# Patient Record
Sex: Female | Born: 1990 | Race: Black or African American | Hispanic: No | Marital: Single | Smoking: Former smoker
Health system: Southern US, Community
[De-identification: ages and names within clinical notes are randomized; demographics above are authoritative.]

## PROBLEM LIST (undated history)

## (undated) DIAGNOSIS — E559 Vitamin D deficiency, unspecified: Secondary | ICD-10-CM

## (undated) DIAGNOSIS — S82142A Displaced bicondylar fracture of left tibia, initial encounter for closed fracture: Secondary | ICD-10-CM

## (undated) DIAGNOSIS — F1290 Cannabis use, unspecified, uncomplicated: Secondary | ICD-10-CM

## (undated) DIAGNOSIS — O24419 Gestational diabetes mellitus in pregnancy, unspecified control: Secondary | ICD-10-CM

## (undated) DIAGNOSIS — F17200 Nicotine dependence, unspecified, uncomplicated: Secondary | ICD-10-CM

## (undated) HISTORY — DX: Gestational diabetes mellitus in pregnancy, unspecified control: O24.419

---

## 2010-01-17 ENCOUNTER — Emergency Department (HOSPITAL_COMMUNITY)
Admission: EM | Admit: 2010-01-17 | Discharge: 2010-01-17 | Payer: Self-pay | Source: Home / Self Care | Admitting: Emergency Medicine

## 2010-01-23 ENCOUNTER — Emergency Department (HOSPITAL_COMMUNITY)
Admission: EM | Admit: 2010-01-23 | Discharge: 2010-01-23 | Payer: Self-pay | Source: Home / Self Care | Admitting: Emergency Medicine

## 2010-09-18 ENCOUNTER — Emergency Department (HOSPITAL_COMMUNITY)
Admission: EM | Admit: 2010-09-18 | Discharge: 2010-09-18 | Disposition: A | Discharge status: Home / Self Care | Payer: No Typology Code available for payment source | Attending: Emergency Medicine | Admitting: Emergency Medicine

## 2010-09-18 ENCOUNTER — Emergency Department (HOSPITAL_COMMUNITY): Payer: Self-pay

## 2010-09-18 DIAGNOSIS — M79609 Pain in unspecified limb: Secondary | ICD-10-CM | POA: Insufficient documentation

## 2011-06-08 ENCOUNTER — Emergency Department (HOSPITAL_COMMUNITY)
Admission: EM | Admit: 2011-06-08 | Discharge: 2011-06-08 | Disposition: A | Discharge status: Home / Self Care | Payer: Medicaid Other | Attending: Emergency Medicine | Admitting: Emergency Medicine

## 2011-06-08 ENCOUNTER — Encounter (HOSPITAL_COMMUNITY): Payer: Self-pay | Admitting: *Deleted

## 2011-06-08 DIAGNOSIS — J029 Acute pharyngitis, unspecified: Secondary | ICD-10-CM | POA: Insufficient documentation

## 2011-06-08 LAB — RAPID STREP SCREEN (MED CTR MEBANE ONLY): Streptococcus, Group A Screen (Direct): NEGATIVE

## 2011-06-08 MED ORDER — AZITHROMYCIN 250 MG PO TABS: ORAL_TABLET | ORAL | Status: AC

## 2011-06-08 MED ORDER — PREDNISONE 20 MG PO TABS
60.0000 mg | ORAL_TABLET | Freq: Once | ORAL | Status: AC
  Administered 2011-06-08: 60 mg via ORAL
  Filled 2011-06-08: qty 3

## 2011-06-08 NOTE — ED Notes (Signed)
The pt has had a sorethroat for one week.  No temp no other symptoms

## 2011-06-08 NOTE — Discharge Instructions (Signed)
Your strep throat test was negative today. Your providers however are concerned for other possible bacterial cause of your throat infection. You were given a prescription for an antibiotic to take for the next 5 days to help treat this infection. Please take this as instructed for the full length of time. Use warm salt rinse to gargle and help with symptoms. Use Tylenol and ibuprofen for pain and fever. Return to the emergency room for any worsening symptoms, difficulty swallowing difficulty breathing or increased swelling in your throat.   Pharyngitis, Viral and Bacterial Pharyngitis is soreness (inflammation) or infection of the pharynx. It is also called a sore throat. CAUSES  Most sore throats are caused by viruses and are part of a cold. However, some sore throats are caused by strep and other bacteria. Sore throats can also be caused by post nasal drip from draining sinuses, allergies and sometimes from sleeping with an open mouth. Infectious sore throats can be spread from person to person by coughing, sneezing and sharing cups or eating utensils. TREATMENT  Sore throats that are viral usually last 3-4 days. Viral illness will get better without medications (antibiotics). Strep throat and other bacterial infections will usually begin to get better about 24-48 hours after you begin to take antibiotics. HOME CARE INSTRUCTIONS   If the caregiver feels there is a bacterial infection or if there is a positive strep test, they will prescribe an antibiotic. The full course of antibiotics must be taken. If the full course of antibiotic is not taken, you or your child may become ill again. If you or your child has strep throat and do not finish all of the medication, serious heart or kidney diseases may develop.   Drink enough water and fluids to keep your urine clear or pale yellow.   Only take over-the-counter or prescription medicines for pain, discomfort or fever as directed by your caregiver.    Get lots of rest.   Gargle with salt water ( tsp. of salt in a glass of water) as often as every 1-2 hours as you need for comfort.   Hard candies may soothe the throat if individual is not at risk for choking. Throat sprays or lozenges may also be used.  SEEK MEDICAL CARE IF:   Large, tender lumps in the neck develop.   A rash develops.   Green, yellow-brown or bloody sputum is coughed up.   Your baby is older than 3 months with a rectal temperature of 100.5 F (38.1 C) or higher for more than 1 day.  SEEK IMMEDIATE MEDICAL CARE IF:   A stiff neck develops.   You or your child are drooling or unable to swallow liquids.   You or your child are vomiting, unable to keep medications or liquids down.   You or your child has severe pain, unrelieved with recommended medications.   You or your child are having difficulty breathing (not due to stuffy nose).   You or your child are unable to fully open your mouth.   You or your child develop redness, swelling, or severe pain anywhere on the neck.   You have a fever.   Your baby is older than 3 months with a rectal temperature of 102 F (38.9 C) or higher.   Your baby is 55 months old or younger with a rectal temperature of 100.4 F (38 C) or higher.  MAKE SURE YOU:   Understand these instructions.   Will watch your condition.   Will get  help right away if you are not doing well or get worse.  Document Released: 02/01/2005 Document Revised: 01/21/2011 Document Reviewed: 05/01/2007 [facility] Patient Information 2012 [place], [place].   RESOURCE GUIDE  Dental Problems  Patients with Medicaid: [facility] [phone] [address]                                           [phone] [address] Phone:  [phone]                                                  Phone:  [phone]  If unable to pay or uninsured, contact:  [facility] or [facility]. to become  qualified for the adult dental clinic.  Chronic Pain Problems Contact [facility]  [phone] Patients need to be referred by their primary care doctor.  Insufficient Money for Medicine Contact [employer]:  call "211" or [facility] (224)221-4042.  No Primary Care Doctor Call [facility]  2135036857 Other agencies that provide inexpensive medical care    [facility]  610-118-3096    [facility]  [phone]    [facility]  [phone]    [facility]  [phone]    [facility]  (807) [phone]    [facility]  [phone]  Psychological Services [facility]  [phone] [facility]  [phone] [facility]   [phone] (emergency services 6417002648)  Substance Abuse Resources [facility]  (512)105-7720 [facility] 984-329-4675 [facility] ([phone] Floydene Flock (254)715-1673 Residential & Outpatient Substance Abuse Program  (567) 841-3464  Abuse/Neglect [facility] Child Abuse Hotline 631-802-9211 [facility] Child Abuse Hotline (302) 122-6894 (After Hours)  Emergency Shelter [facility] 787-102-3471  M[facility] [place] [facility] [phone] [facility] [phone]  MRSA Hotline #:   646-145-2099    [facility] Resources  [facility] [place]     [employer]                          [facility] Dept. [address] [place]                       [address]      [address]  [place]                                                Cristobal Goldmann Phone:  [phone]  Phone:  [phone]                 Phone:  [phone]  [facility] Phone:  [phone]  [place] Child Abuse Hotline (336) 342-1394 (336) 342-3537 (After Hours)   

## 2011-06-08 NOTE — ED Notes (Signed)
PT reports a sore throat started on Monday.

## 2011-06-08 NOTE — ED Provider Notes (Signed)
History     CSN: [id number]  Arrival date & time 06/08/11  2059   First MD Initiated Contact with Patient 06/08/11 2322      Chief Complaint  Patient presents with  . Sore Throat    HPI  History provided by the patient. Patient is a 21 year old female with no significant past medical history who presents with complaints of acute onset sore throat yesterday morning after waking up. Patient reports pain has been persistent and is worse with swallowing. Pain is severe. Patient has tried some Tylenol at home without significant improvement. She denies any other aggravating or alleviating factors. Patient denies any associated fever, chills, sweats, rhinorrhea, nasal congestion, cough, nausea or vomiting. Patient denies any known sick contacts.    History reviewed. No pertinent past medical history.  History reviewed. No pertinent past surgical history.  No family history on file.  History  Substance Use Topics  . Smoking status: Never Smoker   . Smokeless tobacco: Not on file  . Alcohol Use: No    OB History    Grav Para Term Preterm Abortions TAB SAB Ect Mult Living                  Review of Systems  Constitutional: Positive for appetite change. Negative for fever, chills and fatigue.  HENT: Positive for sore throat. Negative for ear pain, congestion, rhinorrhea, neck pain and neck stiffness.   Respiratory: Negative for cough and shortness of breath.   Gastrointestinal: Negative for nausea and vomiting.  Musculoskeletal: Negative for myalgias.  Skin: Negative for rash.  Neurological: Negative for dizziness, light-headedness and headaches.    Allergies  Review of patient's allergies indicates no known allergies.  Home Medications  No current outpatient prescriptions on file.  BP 107/57  Pulse 108  Temp(Src) 99.8 F (37.7 C) (Oral)  Resp 18  SpO2 99%  LMP 06/04/2011  Physical Exam  Nursing note and vitals reviewed. Constitutional: She is oriented to person,  place, and time. She appears well-developed and well-nourished. No distress.  HENT:  Head: Normocephalic and atraumatic.       Pharynx erythematous with slight exudate. Tonsils normal size. Uvula midline. No signs for PTA.  Neck: Normal range of motion. Neck supple.       No meningeal signs  Cardiovascular: Normal rate and regular rhythm.   Pulmonary/Chest: Effort normal and breath sounds normal. No respiratory distress. She has no wheezes. She has no rales.  Abdominal: Soft. There is no tenderness.  Lymphadenopathy:    She has cervical adenopathy.  Neurological: She is alert and oriented to person, place, and time.  Skin: Skin is warm and dry. No rash noted.  Psychiatric: She has a normal mood and affect. Her behavior is normal.    ED Course  Procedures   Results for orders placed during the hospital encounter of 06/08/11  RAPID STREP SCREEN      Component Value Range   Streptococcus, Group A Screen (Direct) NEGATIVE  NEGATIVE        1. Pharyngitis       MDM  11:20 PM patient seen and evaluated. Patient in no acute distress.        Angus Seller, [place] 06/09/11 [phone]

## 2011-06-09 NOTE — ED Provider Notes (Signed)
Medical screening examination/treatment/procedure(s) were performed by non-physician practitioner and as supervising physician I was immediately available for consultation/collaboration.   Vida Roller, MD 06/09/11 [phone]

## 2013-08-01 ENCOUNTER — Emergency Department (HOSPITAL_COMMUNITY): Payer: Self-pay

## 2013-08-01 ENCOUNTER — Encounter (HOSPITAL_COMMUNITY): Payer: Self-pay | Admitting: Emergency Medicine

## 2013-08-01 ENCOUNTER — Inpatient Hospital Stay (HOSPITAL_COMMUNITY)
Admission: EM | Admit: 2013-08-01 | Discharge: 2013-08-06 | DRG: 488 | Disposition: A | Discharge status: Home Health | Payer: Self-pay | Attending: Orthopedic Surgery | Admitting: Orthopedic Surgery

## 2013-08-01 DIAGNOSIS — R443 Hallucinations, unspecified: Secondary | ICD-10-CM | POA: Diagnosis not present

## 2013-08-01 DIAGNOSIS — F1290 Cannabis use, unspecified, uncomplicated: Secondary | ICD-10-CM | POA: Diagnosis present

## 2013-08-01 DIAGNOSIS — S82142A Displaced bicondylar fracture of left tibia, initial encounter for closed fracture: Secondary | ICD-10-CM

## 2013-08-01 DIAGNOSIS — S82109A Unspecified fracture of upper end of unspecified tibia, initial encounter for closed fracture: Secondary | ICD-10-CM | POA: Diagnosis present

## 2013-08-01 DIAGNOSIS — F1210 Cannabis abuse, uncomplicated: Secondary | ICD-10-CM | POA: Diagnosis present

## 2013-08-01 DIAGNOSIS — M949 Disorder of cartilage, unspecified: Secondary | ICD-10-CM

## 2013-08-01 DIAGNOSIS — E559 Vitamin D deficiency, unspecified: Secondary | ICD-10-CM | POA: Diagnosis present

## 2013-08-01 DIAGNOSIS — S82209A Unspecified fracture of shaft of unspecified tibia, initial encounter for closed fracture: Principal | ICD-10-CM | POA: Diagnosis present

## 2013-08-01 DIAGNOSIS — W108XXA Fall (on) (from) other stairs and steps, initial encounter: Secondary | ICD-10-CM | POA: Diagnosis present

## 2013-08-01 DIAGNOSIS — Y9210 Unspecified residential institution as the place of occurrence of the external cause: Secondary | ICD-10-CM | POA: Diagnosis present

## 2013-08-01 DIAGNOSIS — E876 Hypokalemia: Secondary | ICD-10-CM | POA: Diagnosis present

## 2013-08-01 DIAGNOSIS — D62 Acute posthemorrhagic anemia: Secondary | ICD-10-CM | POA: Diagnosis not present

## 2013-08-01 DIAGNOSIS — F17200 Nicotine dependence, unspecified, uncomplicated: Secondary | ICD-10-CM | POA: Diagnosis present

## 2013-08-01 DIAGNOSIS — M899 Disorder of bone, unspecified: Secondary | ICD-10-CM | POA: Diagnosis present

## 2013-08-01 DIAGNOSIS — R7989 Other specified abnormal findings of blood chemistry: Secondary | ICD-10-CM | POA: Diagnosis present

## 2013-08-01 HISTORY — DX: Cannabis use, unspecified, uncomplicated: F12.90

## 2013-08-01 HISTORY — DX: Nicotine dependence, unspecified, uncomplicated: F17.200

## 2013-08-01 HISTORY — DX: Vitamin D deficiency, unspecified: E55.9

## 2013-08-01 HISTORY — DX: Displaced bicondylar fracture of left tibia, initial encounter for closed fracture: S82.142A

## 2013-08-01 MED ORDER — HYDROMORPHONE HCL PF 1 MG/ML IJ SOLN
0.5000 mg | INTRAMUSCULAR | Status: AC | PRN
  Administered 2013-08-01 – 2013-08-02 (×3): 0.5 mg via INTRAVENOUS
  Filled 2013-08-01 (×3): qty 1

## 2013-08-01 NOTE — ED Notes (Signed)
Per PTAR: Pt fell from balcony onto gravel area, fall was from about 5 feet. No neck or back pain. Pt c/o pain to L knee, swelling noted to site, no deformity noted.

## 2013-08-01 NOTE — ED Notes (Signed)
Bed: WA07 Expected date: 08/01/13 Expected time:  Means of arrival:  Comments: EMS

## 2013-08-02 ENCOUNTER — Inpatient Hospital Stay (HOSPITAL_COMMUNITY): Payer: Self-pay

## 2013-08-02 ENCOUNTER — Emergency Department (HOSPITAL_COMMUNITY): Payer: Self-pay

## 2013-08-02 ENCOUNTER — Encounter (HOSPITAL_COMMUNITY): Payer: Self-pay | Admitting: Orthopedic Surgery

## 2013-08-02 DIAGNOSIS — S82142A Displaced bicondylar fracture of left tibia, initial encounter for closed fracture: Secondary | ICD-10-CM

## 2013-08-02 DIAGNOSIS — R7989 Other specified abnormal findings of blood chemistry: Secondary | ICD-10-CM | POA: Diagnosis present

## 2013-08-02 DIAGNOSIS — E559 Vitamin D deficiency, unspecified: Secondary | ICD-10-CM

## 2013-08-02 DIAGNOSIS — F17200 Nicotine dependence, unspecified, uncomplicated: Secondary | ICD-10-CM | POA: Diagnosis present

## 2013-08-02 HISTORY — DX: Vitamin D deficiency, unspecified: E55.9

## 2013-08-02 HISTORY — DX: Displaced bicondylar fracture of left tibia, initial encounter for closed fracture: S82.142A

## 2013-08-02 HISTORY — DX: Nicotine dependence, unspecified, uncomplicated: F17.200

## 2013-08-02 LAB — URINALYSIS, ROUTINE W REFLEX MICROSCOPIC
Bilirubin Urine: NEGATIVE
Glucose, UA: NEGATIVE mg/dL
Ketones, ur: 15 mg/dL — AB
Leukocytes, UA: NEGATIVE
Nitrite: NEGATIVE
Protein, ur: NEGATIVE mg/dL
Specific Gravity, Urine: 1.017 (ref 1.005–1.030)
UROBILINOGEN UA: 1 mg/dL (ref 0.0–1.0)
pH: 7 (ref 5.0–8.0)

## 2013-08-02 LAB — TYPE AND SCREEN
ABO/RH(D): O POS
ANTIBODY SCREEN: NEGATIVE

## 2013-08-02 LAB — APTT: APTT: 28 s (ref 24–37)

## 2013-08-02 LAB — ALBUMIN: Albumin: 4 g/dL (ref 3.5–5.2)

## 2013-08-02 LAB — RAPID URINE DRUG SCREEN, HOSP PERFORMED
Amphetamines: NOT DETECTED
BARBITURATES: NOT DETECTED
BENZODIAZEPINES: NOT DETECTED
Cocaine: NOT DETECTED
Opiates: POSITIVE — AB
TETRAHYDROCANNABINOL: POSITIVE — AB

## 2013-08-02 LAB — URINE MICROSCOPIC-ADD ON

## 2013-08-02 LAB — BASIC METABOLIC PANEL
BUN: 9 mg/dL (ref 6–23)
CALCIUM: 9.4 mg/dL (ref 8.4–10.5)
CHLORIDE: 102 meq/L (ref 96–112)
CO2: 21 meq/L (ref 19–32)
CREATININE: 0.75 mg/dL (ref 0.50–1.10)
GFR calc non Af Amer: >90 mL/min (ref >90)
Glucose, Bld: 122 mg/dL — ABNORMAL HIGH (ref 70–99)
POTASSIUM: 3.2 meq/L — AB (ref 3.7–5.3)
SODIUM: 139 meq/L (ref 137–147)

## 2013-08-02 LAB — CBC
HCT: 39.5 % (ref 36.0–46.0)
Hemoglobin: 13.7 g/dL (ref 12.0–15.0)
MCH: 32 pg (ref 26.0–34.0)
MCHC: 34.7 g/dL (ref 30.0–36.0)
MCV: 92.3 fL (ref 78.0–100.0)
PLATELETS: 295 10*3/uL (ref 150–400)
RBC: 4.28 MIL/uL (ref 3.87–5.11)
RDW: 12 % (ref 11.5–15.5)
WBC: 9.2 10*3/uL (ref 4.0–10.5)

## 2013-08-02 LAB — VITAMIN D 25 HYDROXY (VIT D DEFICIENCY, FRACTURES): Vit D, 25-Hydroxy: 22 ng/mL — ABNORMAL LOW (ref 30–89)

## 2013-08-02 LAB — HCG, SERUM, QUALITATIVE: Preg, Serum: NEGATIVE

## 2013-08-02 LAB — SURGICAL PCR SCREEN
MRSA, PCR: NEGATIVE
Staphylococcus aureus: NEGATIVE

## 2013-08-02 LAB — PROTIME-INR
INR: 1.13 (ref 0.00–1.49)
Prothrombin Time: 14.3 seconds (ref 11.6–15.2)

## 2013-08-02 LAB — ABO/RH: ABO/RH(D): O POS

## 2013-08-02 MED ORDER — MAGNESIUM CITRATE PO SOLN: 1.0000 | Freq: Once | ORAL | Status: AC | PRN

## 2013-08-02 MED ORDER — SODIUM CHLORIDE 0.9 % IV SOLN
1000.0000 mL | Freq: Once | INTRAVENOUS | Status: AC
  Administered 2013-08-02: 1000 mL via INTRAVENOUS

## 2013-08-02 MED ORDER — ACETAMINOPHEN 500 MG PO TABS
1000.0000 mg | ORAL_TABLET | Freq: Three times a day (TID) | ORAL | Status: DC
  Administered 2013-08-02 – 2013-08-03 (×3): 1000 mg via ORAL
  Filled 2013-08-02 (×6): qty 2

## 2013-08-02 MED ORDER — DEXTROSE 5 % IV SOLN
500.0000 mg | Freq: Four times a day (QID) | INTRAVENOUS | Status: DC | PRN
  Filled 2013-08-02: qty 5

## 2013-08-02 MED ORDER — VITAMIN D (ERGOCALCIFEROL) 1.25 MG (50000 UNIT) PO CAPS
50000.0000 [IU] | ORAL_CAPSULE | ORAL | Status: DC
  Administered 2013-08-04: 50000 [IU] via ORAL
  Filled 2013-08-02: qty 1

## 2013-08-02 MED ORDER — OXYCODONE HCL 5 MG PO TABS
5.0000 mg | ORAL_TABLET | ORAL | Status: DC | PRN
  Administered 2013-08-02 (×2): 15 mg via ORAL
  Filled 2013-08-02 (×2): qty 3

## 2013-08-02 MED ORDER — METHOCARBAMOL 500 MG PO TABS
500.0000 mg | ORAL_TABLET | Freq: Four times a day (QID) | ORAL | Status: DC | PRN
  Administered 2013-08-02 (×2): 1000 mg via ORAL
  Administered 2013-08-03: 500 mg via ORAL
  Filled 2013-08-02 (×2): qty 2

## 2013-08-02 MED ORDER — METHOCARBAMOL 500 MG PO TABS: 500.0000 mg | ORAL_TABLET | Freq: Four times a day (QID) | ORAL | Status: DC | PRN

## 2013-08-02 MED ORDER — SENNA 8.6 MG PO TABS
1.0000 | ORAL_TABLET | Freq: Two times a day (BID) | ORAL | Status: DC
  Administered 2013-08-02 – 2013-08-06 (×9): 8.6 mg via ORAL
  Filled 2013-08-02 (×15): qty 1

## 2013-08-02 MED ORDER — HYDROMORPHONE HCL PF 1 MG/ML IJ SOLN
0.5000 mg | INTRAMUSCULAR | Status: DC | PRN
  Administered 2013-08-02 (×3): 1 mg via INTRAVENOUS
  Administered 2013-08-03: 0.5 mg via INTRAVENOUS
  Administered 2013-08-03 (×2): 1 mg via INTRAVENOUS
  Administered 2013-08-03: 0.5 mg via INTRAVENOUS
  Filled 2013-08-02 (×5): qty 1

## 2013-08-02 MED ORDER — SORBITOL 70 % SOLN
30.0000 mL | Freq: Every day | Status: DC | PRN
  Filled 2013-08-02: qty 30

## 2013-08-02 MED ORDER — ONDANSETRON HCL 4 MG/2ML IJ SOLN
4.0000 mg | Freq: Four times a day (QID) | INTRAMUSCULAR | Status: DC | PRN
  Administered 2013-08-02 (×2): 4 mg via INTRAVENOUS
  Filled 2013-08-02 (×3): qty 2

## 2013-08-02 MED ORDER — SODIUM CHLORIDE 0.9 % IV SOLN
INTRAVENOUS | Status: DC
  Administered 2013-08-02 – 2013-08-05 (×5): via INTRAVENOUS

## 2013-08-02 MED ORDER — POLYETHYLENE GLYCOL 3350 17 G PO PACK
17.0000 g | PACK | Freq: Every day | ORAL | Status: DC | PRN
  Filled 2013-08-02: qty 1

## 2013-08-02 MED ORDER — MORPHINE SULFATE 2 MG/ML IJ SOLN: 0.5000 mg | INTRAMUSCULAR | Status: DC | PRN

## 2013-08-02 MED ORDER — SODIUM CHLORIDE 0.9 % IV SOLN
1000.0000 mL | INTRAVENOUS | Status: DC
Start: 2013-08-02 — End: 2013-08-02

## 2013-08-02 MED ORDER — METHOCARBAMOL 1000 MG/10ML IJ SOLN
500.0000 mg | Freq: Four times a day (QID) | INTRAVENOUS | Status: DC | PRN
  Filled 2013-08-02 (×2): qty 10

## 2013-08-02 MED ORDER — HYDROMORPHONE HCL PF 1 MG/ML IJ SOLN
0.5000 mg | INTRAMUSCULAR | Status: DC | PRN
  Administered 2013-08-02 (×4): 0.5 mg via INTRAVENOUS
  Filled 2013-08-02 (×4): qty 1

## 2013-08-02 MED ORDER — OXYCODONE HCL 5 MG PO TABS: 5.0000 mg | ORAL_TABLET | ORAL | Status: DC | PRN

## 2013-08-02 MED ORDER — CEFAZOLIN SODIUM-DEXTROSE 2-3 GM-% IV SOLR
2.0000 g | Freq: Once | INTRAVENOUS | Status: AC
  Administered 2013-08-03 (×2): 2 g via INTRAVENOUS
  Filled 2013-08-02 (×2): qty 50

## 2013-08-02 MED ORDER — HYDROCODONE-ACETAMINOPHEN 5-325 MG PO TABS: 1.0000 | ORAL_TABLET | Freq: Four times a day (QID) | ORAL | Status: DC | PRN

## 2013-08-02 NOTE — Progress Notes (Signed)
Orthopedic Tech Progress Note Patient Details:  Moshe SalisburyDesirae N Galicia 09/29/1990 [id number]  Ortho Devices Type of Ortho Device: Lenora BoysWatson Jones splint Ortho Device/Splint Location: long leg watson jones Ortho Device/Splint Interventions: Application   Cammer, Mickie BailJennifer Carol 08/02/2013, 9:01 AM

## 2013-08-02 NOTE — ED Provider Notes (Signed)
CSN: [id number]     Arrival date & time 08/01/13  2258 History   First MD Initiated Contact with Patient 08/01/13 2306     Chief Complaint  Patient presents with  . Fall  . Knee Pain      HPI The majority of the information was obtained from the patient's friend secondary to severe pain in the left knee.  As reported the patient was playing with friends and was going down a set of stairs when she fell and twisted her left knee and also fell onto her left knee.  It's unclear as to whether or not she tried to jump down the steps.  She reports severe pain in her left knee.  She denies numbness or weakness in her left foot.  Her pain is worsened by palpation and movement of her left knee.  Denies neck injury.  No head injury.  No neck pain.  No weakness of arms.  No chest pain or shortness of breath.  No abdominal pain or back pain.   History reviewed. No pertinent past medical history. History reviewed. No pertinent past surgical history. No family history on file. History  Substance Use Topics  . Smoking status: Never Smoker   . Smokeless tobacco: Not on file  . Alcohol Use: No   OB History   Grav Para Term Preterm Abortions TAB SAB Ect Mult Living                 Review of Systems  All other systems reviewed and are negative.     Allergies  Review of patient's allergies indicates no known allergies.  Home Medications   Prior to Admission medications   Not on File   BP 124/62  Pulse 98  Temp(Src) 98.5 F (36.9 C) (Oral)  Resp 24  Ht 5' (1.524 m)  Wt 110 lb (49.896 kg)  BMI 21.48 kg/m2  SpO2 100% Physical Exam  Nursing note and vitals reviewed. Constitutional: She is oriented to person, place, and time. She appears well-developed and well-nourished. No distress.  HENT:  Head: Normocephalic and atraumatic.  Eyes: EOM are normal.  Neck: Normal range of motion.  Cardiovascular: Normal rate, regular rhythm and normal heart sounds.   Pulmonary/Chest: Effort normal  and breath sounds normal.  Abdominal: Soft. She exhibits no distension. There is no tenderness.  Musculoskeletal: Normal range of motion.  Tenderness and swelling of left knee.  Compartments of left lower extremity are soft.  Normal PT and DP pulse palpable left foot.  Pain with palpation and range of motion of left knee.  Neurological: She is alert and oriented to person, place, and time.  Skin: Skin is warm and dry.  Psychiatric: She has a normal mood and affect. Judgment normal.    ED Course  Procedures (including critical care time) Labs Review Labs Reviewed  BASIC METABOLIC PANEL - Abnormal; Notable for the following:    Potassium 3.2 (*)    Glucose, Bld 122 (*)    All other components within normal limits  CBC  HCG, SERUM, QUALITATIVE  PROTIME-INR  URINALYSIS, ROUTINE W REFLEX MICROSCOPIC  APTT  ALBUMIN  VITAMIN D 25 HYDROXY  TYPE AND SCREEN   SPLINT APPLICATION Date/Time: 06/23/2012 3:38 PM Authorized by: Lyanne CoAMPOS,KEVIN M Consent: Verbal consent obtained. Risks and benefits: risks, benefits and alternatives were discussed Consent given by: patient Splint applied by: orthopedic technician Location details: left knee immobolizer Splint type:  knee immobolizer Supplies used:  knee immobolizer Post-procedure: The splinted body part  was neurovascularly unchanged following the procedure. Patient tolerance: Patient tolerated the procedure well with no immediate complications.     Imaging Review Dg Knee Complete 4 Views Left  08/01/2013   CLINICAL DATA:  Fall.  Left knee pain.  EXAM: LEFT KNEE - COMPLETE 4+ VIEW  COMPARISON:  None.  FINDINGS: Bicondylar tibial plateau fracture is present extending into the metaphysis. Depression of the medial plateau. Lipohemarthrosis evident on the lateral view. Based on metaphyseal extension, this is most compatible with Schatzker 6 fracture. Distal femur appears intact. Patella appears intact.  IMPRESSION: Bicondylar tibial plateau fracture  with meta diaphyseal extension most compatible with Schatzker 6.   Electronically Signed   By: Andreas NewportGeoffrey  Lamke M.D.   On: 08/01/2013 23:57  I personally reviewed the imaging tests through PACS system I reviewed available ER/hospitalization records through the EMR    EKG Interpretation None      MDM   Final diagnoses:  Fracture of left tibial plateau    I spoke with Dr. Roda ShuttersXu was personally reviewed the patient's images and agree use to admission to the hospital.  Compartment soft at this time.  Normal pulses in left foot.  Doubt vascular injury.  Patient and family updated    Lyanne CoKevin M Campos, MD 08/02/13 [phone]

## 2013-08-02 NOTE — ED Notes (Signed)
[facility] at bedside 

## 2013-08-02 NOTE — Progress Notes (Signed)
UR COMPLETED  

## 2013-08-02 NOTE — Treatment Plan (Signed)
Dr. Carola FrostHandy has agreed to take over care of patient.  Erin Matthews. Michael Xu, MD [facility] [phone] 7:56 AM

## 2013-08-02 NOTE — ED Notes (Signed)
[facility], secretary stated nurse unavailable but will call me when she's ready

## 2013-08-02 NOTE — H&P (Signed)
Orthopaedic Trauma Service H&P   Chief Complaint:  Left tibial plateau fracture  HPI:   Patient is a 23 year old black female who fell down some stairs yesterday approximately 5 feet she was playing with friends. Patient uncertain as to how her leg bent under her but she had immediate onset of pain and inability to bear weight on her left leg. Patient was brought to [place] for evaluation where she was found to have a complex left tibial plateau fracture. Patient was seen and evaluated by the emergency room physician and was admitted to the medical surgical floor. Patient seen today not orthopedic trauma service for evaluation. She complains of left knee pain only. She denies any numbness or tingling in lower extremity. She denies any loss of consciousness no head or neck pain. No shortness of breath, no chest pain no abdominal pain. No nausea or vomiting. No other issues are noted. Pain is relieved with rest and pain medication. It is exacerbated with motion. It is primarily located about her left knee. No radiation down to her ankle or up to her hip.  Patient reports that her dietary intake is at in terms of calcium and vitamin D. She does smoke regularly about a half to three quarters a pack a day. No other drug use or alcohol use is noted other than social alcohol. Patient is employed at [employer].  Past Medical History  History reviewed. No pertinent past medical history.  Surgical history  L ankle surgery as infant   Family History  No family history on file.  Social History:  reports that she has never smoked. She does not have any smokeless tobacco history on file. She reports that she does not drink alcohol or use illicit drugs.  Allergies: No Known Allergies  No prescriptions prior to admission    Results for orders placed during the hospital encounter of 08/01/13 (from the past 48 hour(s))  CBC     Status: None   Collection Time    08/01/13 11:48 PM      Result Value Ref  Range   WBC 9.2  4.0 - 10.5 K/uL   RBC 4.28  3.87 - 5.11 MIL/uL   Hemoglobin 13.7  12.0 - 15.0 g/dL   HCT 39.5  36.0 - 46.0 %   MCV 92.3  78.0 - 100.0 fL   MCH 32.0  26.0 - 34.0 pg   MCHC 34.7  30.0 - 36.0 g/dL   RDW 12.0  11.5 - 15.5 %   Platelets 295  150 - 400 K/uL  BASIC METABOLIC PANEL     Status: Abnormal   Collection Time    08/01/13 11:48 PM      Result Value Ref Range   Sodium 139  137 - 147 mEq/L   Potassium 3.2 (*) 3.7 - 5.3 mEq/L   Chloride 102  96 - 112 mEq/L   CO2 21  19 - 32 mEq/L   Glucose, Bld 122 (*) 70 - 99 mg/dL   BUN 9  6 - 23 mg/dL   Creatinine, Ser 0.75  0.50 - 1.10 mg/dL   Calcium 9.4  8.4 - 10.5 mg/dL   GFR calc non Af Amer >90  >90 mL/min   GFR calc Af Amer >90  >90 mL/min   Comment: (NOTE)     The eGFR has been calculated using the CKD EPI equation.     This calculation has not been validated in all clinical situations.     eGFR's persistently <  90 mL/min signify possible Chronic Kidney     Disease.  HCG, SERUM, QUALITATIVE     Status: None   Collection Time    08/01/13 11:48 PM      Result Value Ref Range   Preg, Serum NEGATIVE  NEGATIVE   Comment:            THE SENSITIVITY OF THIS     METHODOLOGY IS >10 mIU/mL.  PROTIME-INR     Status: None   Collection Time    08/02/13  1:09 AM      Result Value Ref Range   Prothrombin Time 14.3  11.6 - 15.2 seconds   INR 1.13  0.00 - 1.49  APTT     Status: None   Collection Time    08/02/13  1:09 AM      Result Value Ref Range   aPTT 28  24 - 37 seconds  ALBUMIN     Status: None   Collection Time    08/02/13  1:09 AM      Result Value Ref Range   Albumin 4.0  3.5 - 5.2 g/dL  VITAMIN D 25 HYDROXY     Status: Abnormal   Collection Time    08/02/13  1:09 AM      Result Value Ref Range   Vit D, 25-Hydroxy 22 (*) 30 - 89 ng/mL   Comment: (NOTE)     This assay accurately quantifies Vitamin D, which is the sum of the     25-Hydroxy forms of Vitamin D2 and D3.  Studies have shown that the     optimum  concentration of 25-Hydroxy Vitamin D is 30 ng/mL or higher.      Concentrations of Vitamin D between 20 and 29 ng/mL are considered to     be insufficient and concentrations less than 20 ng/mL are considered     to be deficient for Vitamin D.     Performed at [facility]     Status: None   Collection Time    08/02/13  1:09 AM      Result Value Ref Range   ABO/RH(D) O POS     Antibody Screen NEG     Sample Expiration 08/05/2013    ABO/RH     Status: None   Collection Time    08/02/13  1:09 AM      Result Value Ref Range   ABO/RH(D) O POS    SURGICAL PCR SCREEN     Status: None   Collection Time    08/02/13  3:41 AM      Result Value Ref Range   MRSA, PCR NEGATIVE  NEGATIVE   Staphylococcus aureus NEGATIVE  NEGATIVE   Comment:            The Xpert SA Assay (FDA     approved for NASAL specimens     in patients over 10 years of age),     is one component of     a comprehensive surveillance     program.  Test performance has     been validated by [employer] for patients greater     than or equal to 23 year old.     It is not intended     to diagnose infection nor to     guide or monitor treatment.  URINALYSIS, ROUTINE W REFLEX MICROSCOPIC     Status: Abnormal   Collection Time  08/02/13  5:12 AM      Result Value Ref Range   Color, Urine YELLOW  YELLOW   APPearance CLEAR  CLEAR   Specific Gravity, Urine 1.017  1.005 - 1.030   pH 7.0  5.0 - 8.0   Glucose, UA NEGATIVE  NEGATIVE mg/dL   Hgb urine dipstick TRACE (*) NEGATIVE   Bilirubin Urine NEGATIVE  NEGATIVE   Ketones, ur 15 (*) NEGATIVE mg/dL   Protein, ur NEGATIVE  NEGATIVE mg/dL   Urobilinogen, UA 1.0  0.0 - 1.0 mg/dL   Nitrite NEGATIVE  NEGATIVE   Leukocytes, UA NEGATIVE  NEGATIVE  URINE MICROSCOPIC-ADD ON     Status: None   Collection Time    08/02/13  5:12 AM      Result Value Ref Range   Squamous Epithelial / LPF RARE  RARE   WBC, UA 0-2  <3 WBC/hpf   RBC / HPF 3-6  <3  RBC/hpf   Bacteria, UA RARE  RARE   Ct Knee Left Wo Contrast  08/02/2013   CLINICAL DATA:  Fall.  Knee pain.  Tibial plateau fracture.  EXAM: CT OF THE Left KNEE WITHOUT CONTRAST  TECHNIQUE: Multidetector CT imaging of the left knee was performed according to the standard protocol. Multiplanar CT image reconstructions were also generated.  COMPARISON:  Radiographs yesterday.  FINDINGS: Comminuted depressed tibial plateau fracture is present involving both tibial condyles. Additionally, there is a impaction fracture of the proximal fibular head adjacent to the proximal tibia-fibular joint. There is a large bone fragment containing the tibial eminence. Marked depression and impaction of the lateral tibial condyle weight-bearing surface, nearly to the level of the lateral tibial metaphyseal curve on coronal reconstructed images. Maximal depression is 25 mm in the lateral plateau. The medial plateau is comminuted but mildly displaced with mild depression estimated at 5 mm. Fracture planes extend into the metaphysis.  Fibular shaft appears intact. Longitudinal fracture plane through the tibial diaphysis extends about 9 cm distally the to the level of the prominent vascular channel. The patella and distal femur appear intact.  IMPRESSION: Schatzker 6 bicondylar tibial plateau fracture. Marked depression and rotation of lateral tibial condyle articular surface fragment. Impaction fracture of the fibular head adjacent to the proximal tibia-fibular joint   Electronically Signed   By: Dereck Ligas M.D.   On: 08/02/2013 01:36   Dg Knee Complete 4 Views Left  08/01/2013   CLINICAL DATA:  Fall.  Left knee pain.  EXAM: LEFT KNEE - COMPLETE 4+ VIEW  COMPARISON:  None.  FINDINGS: Bicondylar tibial plateau fracture is present extending into the metaphysis. Depression of the medial plateau. Lipohemarthrosis evident on the lateral view. Based on metaphyseal extension, this is most compatible with Schatzker 6 fracture. Distal  femur appears intact. Patella appears intact.  IMPRESSION: Bicondylar tibial plateau fracture with meta diaphyseal extension most compatible with Schatzker 6.   Electronically Signed   By: Dereck Ligas M.D.   On: 08/01/2013 23:57    Review of Systems  Constitutional: Negative for fever and chills.  Eyes: Negative for blurred vision and double vision.       Wears glasses  Respiratory: Negative for shortness of breath and wheezing.   Cardiovascular: Negative for chest pain and palpitations.  Gastrointestinal: Negative for nausea, vomiting and abdominal pain.  Genitourinary: Negative for dysuria, frequency and flank pain.  Musculoskeletal:       Left knee pain  Neurological: Negative for tingling, sensory change and headaches.  Endo/Heme/Allergies: Does not  bruise/bleed easily.    Blood pressure 150/82, pulse 92, temperature 98.4 F (36.9 C), temperature source Oral, resp. rate 16, height 5' (1.524 m), weight 52.028 kg (114 lb 11.2 oz), SpO2 100.00%. Physical Exam  Constitutional: She is oriented to person, place, and time. Vital signs are normal. She appears well-developed and well-nourished. She is cooperative. No distress.  HENT:  Head: Normocephalic and atraumatic.  Nose: Nose normal.  Mouth/Throat: Oropharynx is clear and moist and mucous membranes are normal.  Eyes: EOM are normal. Pupils are equal, round, and reactive to light.  glasses  Neck: Normal range of motion and full passive range of motion without pain. No spinous process tenderness and no muscular tenderness present.  Cardiovascular: Normal rate, regular rhythm, S1 normal and S2 normal.   Respiratory: Effort normal. No respiratory distress. She has no wheezes. She has no rhonchi. She has no rales.  Clear to auscultation bilaterally  GI:  Soft, nontender, nondistended,+ bowel sounds  Musculoskeletal:  Bilateral upper extremities are without acute findings. Motor and sensory functions are grossly intact. Extremities  are warm with palpable peripheral pulses.  Pelvis   No instability with bony evaluation. No pain with lateral compression or AP compression  Right lower extremity   No acute findings.   Full active range of motion   No blocks to motion noted at the ankle knee or hip.   No Pain with manipulation of the right leg   Motor and sensory functions grossly intact   Palpable peripheral pulses are noted  Left lower extremity Inspection:   Patient is in a knee immobilizer. A moderate left knee effusion is appreciated   No open wounds noted to the knee   Old surgical scar noted to the inferior left ankle  Bony eval:    Tender to palpation proximal tibia     Hip, femur, patella, ankle and foot are without tenderness  Soft tissue:    Unable to evaluate ligamentous structures of the left knee the to acute fracture    Soft tissue structures of the thigh, ankle and foot are unremarkable. Ankle is stable evaluation     Skin over the proximal tibia is soft and wrinkled with compression. There is swelling present when compared to the contralateral side but soft tissue is still supple  ROM:    Good range of motion of the ankle is noted.    Did not have patient perform range of motion of knee or hip secondary to fracture  Sensation:    DPN, SPN, TN sensory functions intact  Motor:    EHL, FHL, anterior tibialis, posterior tibialis, peroneals and gastrocsoleus complex intact  Vascular:    Palpable dorsalis pedis and posterior tibialis pulses noted   Compartments are soft and nontender   No pain with passive stretching   Extremity is warm    Neurological: She is alert and oriented to person, place, and time.  Skin: Skin is warm and intact. No abrasion, no ecchymosis and no rash noted. No erythema.  Psychiatric: She has a normal mood and affect. Her speech is normal and behavior is normal. Cognition and memory are normal.     Assessment/Plan   23 year old black female status post fall  down stairs with complex left bicondylar tibial plateau fracture  1. Fall down stairs  2. Complex left bicondylar tibial plateau fracture with severe lateral articular joint depression, Schatzker 6  Patient will require ORIF to restore joint surface congruity, no instability and alignment. In all likelihood I suspect  she probably has a significant meniscal injury and is probably incarcerated in the fracture site. This will be evaluated intraoperatively.  Plan for OR tomorrow  Will have patient placed into a bulky compressive wrap to help and further swelling.  Aggressive ice and elevation to the level of the heart to minimize swelling  Continue with bedrest for now   Patient will be nonweightbearing for 8 weeks after surgery but will have unrestricted range of motion surgery.  3. vitamin D deficiency/metabolic bone disease  25 hydroxy vitamin D level is severely depressed for a patient this age. We'll confirm with repeat evaluation as well as check 1-25 vitamin D levels. Will also check TSH, PTH and other nutritional parameters to see if there is any additional identifiable causes for her vitamin D deficiency other than nutritional intake.  Start on vitamin D supplementation 50,000 IUs of vitamin D2 weekly for the next 8 weeks  Patient is really in the prime age range to maximize her bone density and reaming to optimize this to prevent/minimize any future fractures or other conditions related to vitamin D deficiency/low bone density  4. nicotine dependence  Discussed the negative effects of nicotine on bone and wound healing. Patient states that she will attempt to cut back  No nicotine supplements including patches on hospital or at discharge  5. Pain management:   Tylenol 1000 mg every 8 hours  Oxycodone 5-15 mg q4h prn  Robaxin [phone] mg q6h prn  Dilaudid 0.5 mg iv q2g prn severe breakthrough pain  No PCA at current time so we can continue to monitor for compartment syndrome, will  likely allow patient had a PCA postoperatively  6. ABL anemia/Hemodynamics  Stable  7. Medical issues   No chronic medical conditions  8. DVT/PE prophylaxis:  SCDs for now  Lovenox  9. Activity:  Bedrest for now  Ice and elevation of left leg  10. FEN/Foley/Lines:  Regular diet  Npo after midnight  Okay to place Foley catheter as patient unable to use a bedpan  11. Impediments to fracture healing:  Nicotine dependence  Vitamin D deficiency  12. Dispo:  OR tomorrow for ORIF complex left tibial plateau fracture   Jari Pigg, PA-C [facility] [phone] (P) 08/02/2013, 8:13 AM

## 2013-08-03 ENCOUNTER — Inpatient Hospital Stay (HOSPITAL_COMMUNITY): Payer: Self-pay

## 2013-08-03 ENCOUNTER — Inpatient Hospital Stay (HOSPITAL_COMMUNITY): Payer: Self-pay | Admitting: Anesthesiology

## 2013-08-03 ENCOUNTER — Encounter (HOSPITAL_COMMUNITY)
Admission: EM | Disposition: A | Discharge status: Home Health | Payer: Self-pay | Source: Home / Self Care | Attending: Orthopedic Surgery

## 2013-08-03 ENCOUNTER — Encounter (HOSPITAL_COMMUNITY): Payer: Self-pay | Admitting: Anesthesiology

## 2013-08-03 HISTORY — PX: FASCIOTOMY: SHX132

## 2013-08-03 HISTORY — PX: ORIF TIBIA PLATEAU: SHX2132

## 2013-08-03 HISTORY — PX: MENISCUS REPAIR: SHX5179

## 2013-08-03 LAB — CREATININE, SERUM: CREATININE: 0.64 mg/dL (ref 0.50–1.10)

## 2013-08-03 LAB — CBC
HCT: 35 % — ABNORMAL LOW (ref 36.0–46.0)
Hemoglobin: 12.2 g/dL (ref 12.0–15.0)
MCH: 32.2 pg (ref 26.0–34.0)
MCHC: 34.9 g/dL (ref 30.0–36.0)
MCV: 92.3 fL (ref 78.0–100.0)
Platelets: 206 K/uL (ref 150–400)
RBC: 3.79 MIL/uL — ABNORMAL LOW (ref 3.87–5.11)
RDW: 11.8 % (ref 11.5–15.5)
WBC: 9.6 K/uL (ref 4.0–10.5)

## 2013-08-03 SURGERY — OPEN REDUCTION INTERNAL FIXATION (ORIF) TIBIAL PLATEAU
Anesthesia: General | Laterality: Left

## 2013-08-03 MED ORDER — 0.9 % SODIUM CHLORIDE (POUR BTL) OPTIME
TOPICAL | Status: DC | PRN
  Administered 2013-08-03: 1000 mL

## 2013-08-03 MED ORDER — OXYCODONE HCL 5 MG PO TABS
ORAL_TABLET | ORAL | Status: AC
  Filled 2013-08-03: qty 2

## 2013-08-03 MED ORDER — CEFAZOLIN SODIUM 1-5 GM-% IV SOLN
1.0000 g | Freq: Four times a day (QID) | INTRAVENOUS | Status: AC
  Administered 2013-08-03 – 2013-08-04 (×3): 1 g via INTRAVENOUS
  Filled 2013-08-03 (×3): qty 50

## 2013-08-03 MED ORDER — DIPHENHYDRAMINE HCL 12.5 MG/5ML PO ELIX: 12.5000 mg | ORAL_SOLUTION | ORAL | Status: DC | PRN

## 2013-08-03 MED ORDER — LIDOCAINE HCL (CARDIAC) 20 MG/ML IV SOLN
INTRAVENOUS | Status: DC | PRN
  Administered 2013-08-03: 50 mg via INTRAVENOUS

## 2013-08-03 MED ORDER — MIDAZOLAM HCL 5 MG/5ML IJ SOLN
INTRAMUSCULAR | Status: DC | PRN
  Administered 2013-08-03: 2 mg via INTRAVENOUS

## 2013-08-03 MED ORDER — DIPHENHYDRAMINE HCL 50 MG/ML IJ SOLN: 12.5000 mg | Freq: Four times a day (QID) | INTRAMUSCULAR | Status: DC | PRN

## 2013-08-03 MED ORDER — POLYETHYLENE GLYCOL 3350 17 G PO PACK: 17.0000 g | PACK | Freq: Every day | ORAL | Status: DC | PRN

## 2013-08-03 MED ORDER — ONDANSETRON HCL 4 MG/2ML IJ SOLN
4.0000 mg | Freq: Four times a day (QID) | INTRAMUSCULAR | Status: DC | PRN
  Filled 2013-08-03: qty 2

## 2013-08-03 MED ORDER — METOCLOPRAMIDE HCL 5 MG/ML IJ SOLN: 10.0000 mg | Freq: Once | INTRAMUSCULAR | Status: DC | PRN

## 2013-08-03 MED ORDER — ENOXAPARIN SODIUM 40 MG/0.4ML ~~LOC~~ SOLN
40.0000 mg | SUBCUTANEOUS | Status: DC
  Administered 2013-08-04 – 2013-08-06 (×3): 40 mg via SUBCUTANEOUS
  Filled 2013-08-03 (×5): qty 0.4

## 2013-08-03 MED ORDER — OXYCODONE HCL 5 MG PO TABS: 5.0000 mg | ORAL_TABLET | Freq: Once | ORAL | Status: DC | PRN

## 2013-08-03 MED ORDER — DEXAMETHASONE SODIUM PHOSPHATE 4 MG/ML IJ SOLN
INTRAMUSCULAR | Status: DC | PRN
  Administered 2013-08-03: 8 mg via INTRAVENOUS

## 2013-08-03 MED ORDER — HYDROMORPHONE 0.3 MG/ML IV SOLN
INTRAVENOUS | Status: DC
  Administered 2013-08-03: 14:00:00 via INTRAVENOUS
  Administered 2013-08-03: 2.1 mg via INTRAVENOUS
  Administered 2013-08-03: 1.4 mg via INTRAVENOUS
  Administered 2013-08-03: 14:00:00 via INTRAVENOUS
  Administered 2013-08-04: 2.4 mg via INTRAVENOUS
  Administered 2013-08-04: 19:00:00 via INTRAVENOUS
  Administered 2013-08-04: 2.1 mg via INTRAVENOUS
  Administered 2013-08-04: 0.6 mg via INTRAVENOUS
  Administered 2013-08-04: 2.1 mg via INTRAVENOUS
  Administered 2013-08-04: 02:00:00 via INTRAVENOUS
  Administered 2013-08-05: 5.6 mg via INTRAVENOUS
  Administered 2013-08-05: 05:00:00 via INTRAVENOUS
  Administered 2013-08-05: 7.5 mg via INTRAVENOUS
  Filled 2013-08-03 (×3): qty 25

## 2013-08-03 MED ORDER — PROPOFOL 10 MG/ML IV BOLUS
INTRAVENOUS | Status: DC | PRN
  Administered 2013-08-03: 190 mg via INTRAVENOUS

## 2013-08-03 MED ORDER — DIPHENHYDRAMINE HCL 12.5 MG/5ML PO ELIX: 12.5000 mg | ORAL_SOLUTION | Freq: Four times a day (QID) | ORAL | Status: DC | PRN

## 2013-08-03 MED ORDER — ROCURONIUM BROMIDE 100 MG/10ML IV SOLN
INTRAVENOUS | Status: DC | PRN
  Administered 2013-08-03: 40 mg via INTRAVENOUS
  Administered 2013-08-03: 10 mg via INTRAVENOUS

## 2013-08-03 MED ORDER — METOCLOPRAMIDE HCL 5 MG PO TABS: 5.0000 mg | ORAL_TABLET | Freq: Three times a day (TID) | ORAL | Status: DC | PRN

## 2013-08-03 MED ORDER — GLYCOPYRROLATE 0.2 MG/ML IJ SOLN
INTRAMUSCULAR | Status: DC | PRN
  Administered 2013-08-03: 0.4 mg via INTRAVENOUS

## 2013-08-03 MED ORDER — NALOXONE HCL 0.4 MG/ML IJ SOLN: 0.4000 mg | INTRAMUSCULAR | Status: DC | PRN

## 2013-08-03 MED ORDER — LACTATED RINGERS IV SOLN
INTRAVENOUS | Status: DC | PRN
  Administered 2013-08-03 (×2): via INTRAVENOUS

## 2013-08-03 MED ORDER — SODIUM CHLORIDE 0.9 % IJ SOLN: 9.0000 mL | INTRAMUSCULAR | Status: DC | PRN

## 2013-08-03 MED ORDER — ACETAMINOPHEN 10 MG/ML IV SOLN
1000.0000 mg | Freq: Four times a day (QID) | INTRAVENOUS | Status: AC
  Administered 2013-08-03 – 2013-08-04 (×4): 1000 mg via INTRAVENOUS
  Filled 2013-08-03 (×4): qty 100

## 2013-08-03 MED ORDER — ONDANSETRON HCL 4 MG PO TABS: 4.0000 mg | ORAL_TABLET | Freq: Four times a day (QID) | ORAL | Status: DC | PRN

## 2013-08-03 MED ORDER — OXYCODONE HCL 5 MG PO TABS
5.0000 mg | ORAL_TABLET | ORAL | Status: DC | PRN
  Administered 2013-08-03 – 2013-08-06 (×4): 10 mg via ORAL
  Filled 2013-08-03 (×3): qty 2

## 2013-08-03 MED ORDER — DOCUSATE SODIUM 100 MG PO CAPS
100.0000 mg | ORAL_CAPSULE | Freq: Two times a day (BID) | ORAL | Status: DC
  Administered 2013-08-03 – 2013-08-06 (×6): 100 mg via ORAL
  Filled 2013-08-03 (×6): qty 1

## 2013-08-03 MED ORDER — HYDROMORPHONE HCL PF 1 MG/ML IJ SOLN: 0.2500 mg | INTRAMUSCULAR | Status: DC | PRN

## 2013-08-03 MED ORDER — ARTIFICIAL TEARS OP OINT
TOPICAL_OINTMENT | OPHTHALMIC | Status: DC | PRN
  Administered 2013-08-03: 1 via OPHTHALMIC

## 2013-08-03 MED ORDER — HYDROMORPHONE 0.3 MG/ML IV SOLN
INTRAVENOUS | Status: AC
  Filled 2013-08-03: qty 25

## 2013-08-03 MED ORDER — ONDANSETRON HCL 4 MG/2ML IJ SOLN: 4.0000 mg | Freq: Four times a day (QID) | INTRAMUSCULAR | Status: DC | PRN

## 2013-08-03 MED ORDER — METHOCARBAMOL 500 MG PO TABS
ORAL_TABLET | ORAL | Status: AC
  Filled 2013-08-03: qty 1

## 2013-08-03 MED ORDER — METOCLOPRAMIDE HCL 5 MG/ML IJ SOLN: 5.0000 mg | Freq: Three times a day (TID) | INTRAMUSCULAR | Status: DC | PRN

## 2013-08-03 MED ORDER — OXYCODONE HCL 5 MG/5ML PO SOLN: 5.0000 mg | Freq: Once | ORAL | Status: DC | PRN

## 2013-08-03 MED ORDER — PHENYLEPHRINE HCL 10 MG/ML IJ SOLN
INTRAMUSCULAR | Status: DC | PRN
Start: 2013-08-03 — End: 2013-08-03
  Administered 2013-08-03 (×2): 80 ug via INTRAVENOUS

## 2013-08-03 MED ORDER — FENTANYL CITRATE 0.05 MG/ML IJ SOLN
INTRAMUSCULAR | Status: DC | PRN
  Administered 2013-08-03: 50 ug via INTRAVENOUS
  Administered 2013-08-03 (×2): 100 ug via INTRAVENOUS

## 2013-08-03 MED ORDER — SORBITOL 70 % SOLN: 30.0000 mL | Freq: Every day | Status: DC | PRN

## 2013-08-03 MED ORDER — MAGNESIUM CITRATE PO SOLN
1.0000 | Freq: Once | ORAL | Status: AC | PRN
  Administered 2013-08-03: 1 via ORAL
  Filled 2013-08-03 (×2): qty 296

## 2013-08-03 MED ORDER — NEOSTIGMINE METHYLSULFATE 10 MG/10ML IV SOLN
INTRAVENOUS | Status: DC | PRN
Start: 2013-08-03 — End: 2013-08-03
  Administered 2013-08-03: 3 mg via INTRAVENOUS

## 2013-08-03 MED ORDER — ONDANSETRON HCL 4 MG/2ML IJ SOLN
INTRAMUSCULAR | Status: DC | PRN
  Administered 2013-08-03: 4 mg via INTRAVENOUS

## 2013-08-03 SURGICAL SUPPLY — 106 items
BANDAGE ELASTIC 4 VELCRO ST LF (GAUZE/BANDAGES/DRESSINGS) ×3 IMPLANT
BANDAGE ELASTIC 6 VELCRO ST LF (GAUZE/BANDAGES/DRESSINGS) ×3 IMPLANT
BANDAGE ESMARK 6X9 LF (GAUZE/BANDAGES/DRESSINGS) ×1 IMPLANT
BANDAGE GAUZE ELAST BULKY 4 IN (GAUZE/BANDAGES/DRESSINGS) ×3 IMPLANT
BIT DRILL 100X2.5XANTM LCK (BIT) IMPLANT
BIT DRL 100X2.5XANTM LCK (BIT) ×1
BLADE SURG 10 STRL SS (BLADE) ×3 IMPLANT
BLADE SURG 15 STRL LF DISP TIS (BLADE) ×1 IMPLANT
BLADE SURG 15 STRL SS (BLADE) ×3
BLADE SURG ROTATE 9660 (MISCELLANEOUS) IMPLANT
BNDG CMPR 9X6 STRL LF SNTH (GAUZE/BANDAGES/DRESSINGS) ×1
BNDG COHESIVE 4X5 TAN STRL (GAUZE/BANDAGES/DRESSINGS) ×3 IMPLANT
BNDG ESMARK 6X9 LF (GAUZE/BANDAGES/DRESSINGS) ×3
BRUSH SCRUB DISP (MISCELLANEOUS) ×6 IMPLANT
CANISTER SUCT 3000ML (MISCELLANEOUS) ×3 IMPLANT
CEMENT CAL PHOSPHATE 5CC (Cement) ×2 IMPLANT
COMPONENT TIB PRX PSTMD 3.5 2H (Orthopedic Implant) IMPLANT
COVER MAYO STAND STRL (DRAPES) ×3 IMPLANT
COVER SURGICAL LIGHT HANDLE (MISCELLANEOUS) ×3 IMPLANT
DRAPE C-ARM 42X72 X-RAY (DRAPES) ×3 IMPLANT
DRAPE C-ARMOR (DRAPES) ×3 IMPLANT
DRAPE INCISE IOBAN 66X45 STRL (DRAPES) ×3 IMPLANT
DRAPE ORTHO SPLIT 77X108 STRL (DRAPES)
DRAPE SURG ORHT 6 SPLT 77X108 (DRAPES) IMPLANT
DRAPE U-SHAPE 47X51 STRL (DRAPES) ×3 IMPLANT
DRILL BIT 2.5MM (BIT) ×3
DRILL BIT 2.7X100 214235006 DU (MISCELLANEOUS) ×2 IMPLANT
DRSG ADAPTIC 3X8 NADH LF (GAUZE/BANDAGES/DRESSINGS) ×5 IMPLANT
DRSG PAD ABDOMINAL 8X10 ST (GAUZE/BANDAGES/DRESSINGS) ×12 IMPLANT
ELECT REM PT RETURN 9FT ADLT (ELECTROSURGICAL) ×3
ELECTRODE REM PT RTRN 9FT ADLT (ELECTROSURGICAL) ×1 IMPLANT
EVACUATOR 1/8 PVC DRAIN (DRAIN) IMPLANT
EVACUATOR 3/16  PVC DRAIN (DRAIN)
EVACUATOR 3/16 PVC DRAIN (DRAIN) IMPLANT
GLOVE BIO SURGEON STRL SZ 6.5 (GLOVE) ×2 IMPLANT
GLOVE BIO SURGEON STRL SZ7.5 (GLOVE) ×3 IMPLANT
GLOVE BIO SURGEON STRL SZ8 (GLOVE) ×3 IMPLANT
GLOVE BIO SURGEONS STRL SZ 6.5 (GLOVE) ×2
GLOVE BIOGEL PI IND STRL 6.5 (GLOVE) IMPLANT
GLOVE BIOGEL PI IND STRL 7.5 (GLOVE) ×1 IMPLANT
GLOVE BIOGEL PI IND STRL 8 (GLOVE) ×1 IMPLANT
GLOVE BIOGEL PI INDICATOR 6.5 (GLOVE) ×4
GLOVE BIOGEL PI INDICATOR 7.5 (GLOVE) ×2
GLOVE BIOGEL PI INDICATOR 8 (GLOVE) ×2
GOWN STRL REUS W/ TWL LRG LVL3 (GOWN DISPOSABLE) ×2 IMPLANT
GOWN STRL REUS W/ TWL XL LVL3 (GOWN DISPOSABLE) ×1 IMPLANT
GOWN STRL REUS W/TWL LRG LVL3 (GOWN DISPOSABLE) ×6
GOWN STRL REUS W/TWL XL LVL3 (GOWN DISPOSABLE) ×3
IMMOBILIZER KNEE 22 UNIV (SOFTGOODS) ×3 IMPLANT
K-WIRE PLA 9 .062 (WIRE) ×10 IMPLANT
KIT BASIN OR (CUSTOM PROCEDURE TRAY) ×3 IMPLANT
KIT ROOM TURNOVER OR (KITS) ×3 IMPLANT
NDL SUT 6 .5 CRC .975X.05 MAYO (NEEDLE) IMPLANT
NEEDLE 22X1 1/2 (OR ONLY) (NEEDLE) ×2 IMPLANT
NEEDLE MAYO TAPER (NEEDLE)
NS IRRIG 1000ML POUR BTL (IV SOLUTION) ×3 IMPLANT
PACK ORTHO EXTREMITY (CUSTOM PROCEDURE TRAY) ×3 IMPLANT
PAD ABD 8X10 STRL (GAUZE/BANDAGES/DRESSINGS) ×8 IMPLANT
PAD ARMBOARD 7.5X6 YLW CONV (MISCELLANEOUS) ×6 IMPLANT
PAD CAST 4YDX4 CTTN HI CHSV (CAST SUPPLIES) ×1 IMPLANT
PADDING CAST COTTON 4X4 STRL (CAST SUPPLIES) ×3
PADDING CAST COTTON 6X4 STRL (CAST SUPPLIES) ×3 IMPLANT
PASSER SUT SWANSON 36MM LOOP (INSTRUMENTS) ×2 IMPLANT
PIN STEINMAN DBL END 9X3/32 (PIN) ×2 IMPLANT
PLATE LOCK 7H STD LT PROX TIB (Plate) ×2 IMPLANT
SCREW CANC FT 4.0X22 (Screw) ×2 IMPLANT
SCREW CORT 3.5X26 815037026 (Screw) ×2 IMPLANT
SCREW CORT 3.5X30 815037030 (Screw) ×2 IMPLANT
SCREW CORTEX 3.5 40MM (Screw) ×2 IMPLANT
SCREW CORTEX 3.5 45MM (Screw) ×2 IMPLANT
SCREW CORTEX 3.5 55MM (Screw) ×4 IMPLANT
SCREW LOCK 3.5X50 DIST TIB (Screw) ×2 IMPLANT
SCREW LOCK 3.5X65 816135065 (Screw) ×6 IMPLANT
SCREW LOCK 3.5X65 DIST TIB (Screw) ×4 IMPLANT
SCREW LOCK CORT ST 3.5X40 (Screw) IMPLANT
SCREW LOCK CORT STAR 3.5X22 (Screw) ×2 IMPLANT
SCREW LOCK CORT STAR 3.5X50 (Screw) ×2 IMPLANT
SCREW LOCK CORT STAR 3.5X60 (Screw) ×8 IMPLANT
SCREW LOCK T15 FT 20X3.5XST (Screw) IMPLANT
SCREW LOCKING 3.5X20 (Screw) ×9 IMPLANT
SPONGE GAUZE 4X4 12PLY (GAUZE/BANDAGES/DRESSINGS) ×3 IMPLANT
SPONGE GAUZE 4X4 12PLY STER LF (GAUZE/BANDAGES/DRESSINGS) ×4 IMPLANT
SPONGE LAP 18X18 X RAY DECT (DISPOSABLE) ×5 IMPLANT
STAPLER VISISTAT 35W (STAPLE) ×3 IMPLANT
STOCKINETTE IMPERVIOUS LG (DRAPES) ×3 IMPLANT
SUCTION FRAZIER TIP 10 FR DISP (SUCTIONS) ×3 IMPLANT
SUT ETHILON 3 0 PS 1 (SUTURE) ×6 IMPLANT
SUT FIBERWIRE #2 38 T-5 BLUE (SUTURE) ×3
SUT PROLENE 0 CT 2 (SUTURE) ×10 IMPLANT
SUT VIC AB 0 CT1 27 (SUTURE) ×3
SUT VIC AB 0 CT1 27XBRD ANBCTR (SUTURE) ×1 IMPLANT
SUT VIC AB 1 CT1 27 (SUTURE) ×3
SUT VIC AB 1 CT1 27XBRD ANBCTR (SUTURE) ×1 IMPLANT
SUT VIC AB 2-0 CT1 27 (SUTURE) ×6
SUT VIC AB 2-0 CT1 TAPERPNT 27 (SUTURE) ×2 IMPLANT
SUTURE FIBERWR #2 38 T-5 BLUE (SUTURE) IMPLANT
SYR 20ML ECCENTRIC (SYRINGE) ×2 IMPLANT
TIBIA PROX POSTMED LCP 3.5 2H (Orthopedic Implant) ×3 IMPLANT
TOWEL OR 17X24 6PK STRL BLUE (TOWEL DISPOSABLE) ×5 IMPLANT
TOWEL OR 17X26 10 PK STRL BLUE (TOWEL DISPOSABLE) ×6 IMPLANT
TRAY FOLEY CATH 16FRSI W/METER (SET/KITS/TRAYS/PACK) ×2 IMPLANT
TUBE CONNECTING 12'X1/4 (SUCTIONS) ×1
TUBE CONNECTING 12X1/4 (SUCTIONS) ×2 IMPLANT
WATER STERILE IRR 1000ML POUR (IV SOLUTION) ×6 IMPLANT
WIRE K 1.6MM 144256 (MISCELLANEOUS) ×12 IMPLANT
YANKAUER SUCT BULB TIP NO VENT (SUCTIONS) ×3 IMPLANT

## 2013-08-03 NOTE — Transfer of Care (Signed)
Immediate Anesthesia Transfer of Care Note  Patient: Erin Matthews  Procedure(s) Performed: Procedure(s): OPEN REDUCTION INTERNAL FIXATION (ORIF) TIBIAL PLATEAU WITH REPAIR OF TIBIAL EMMINENCE (Left) REPAIR OF LATERAL MENISCUS (Left) ANTERIOR COMPARTMENT FASCIOTOMY (Left)  Patient Location: PACU  Anesthesia Type:General  Level of Consciousness: awake and alert   Airway & Oxygen Therapy: Patient Spontanous Breathing and Patient connected to face mask oxygen  Post-op Assessment: Report given to PACU RN, Post -op Vital signs reviewed and stable and Patient moving all extremities  Post vital signs: Reviewed and stable  Complications: No apparent anesthesia complications

## 2013-08-03 NOTE — Anesthesia Preprocedure Evaluation (Addendum)
Anesthesia Evaluation  Patient identified by MRN, date of birth, ID band Patient awake    Reviewed: Allergy & Precautions, H&P , NPO status , Patient's Chart, lab work & pertinent test results, reviewed documented beta blocker date and time   Airway Mallampati: II TM Distance: >3 FB Neck ROM: full    Dental  (+) Teeth Intact, Dental Advisory Given   Pulmonary neg pulmonary ROS, Current Smoker,  breath sounds clear to auscultation        Cardiovascular negative cardio ROS  Rhythm:regular     Neuro/Psych negative neurological ROS  negative psych ROS   GI/Hepatic negative GI ROS, Neg liver ROS,   Endo/Other  negative endocrine ROS  Renal/GU negative Renal ROS  negative genitourinary   Musculoskeletal   Abdominal   Peds  Hematology negative hematology ROS (+)   Anesthesia Other Findings See surgeon's H&P   Reproductive/Obstetrics negative OB ROS                         Anesthesia Physical Anesthesia Plan  ASA: I  Anesthesia Plan: General   Post-op Pain Management:    Induction: Intravenous  Airway Management Planned: Oral ETT and LMA  Additional Equipment:   Intra-op Plan:   Post-operative Plan: Extubation in OR  Informed Consent: I have reviewed the patients History and Physical, chart, labs and discussed the procedure including the risks, benefits and alternatives for the proposed anesthesia with the patient or authorized representative who has indicated his/her understanding and acceptance.   Dental Advisory Given  Plan Discussed with: CRNA and Surgeon  Anesthesia Plan Comments:         Anesthesia Quick Evaluation

## 2013-08-03 NOTE — Progress Notes (Signed)
Orthopedic Tech Progress Note Patient Details:  Erin SalisburyDesirae N Matthews 06/26/1990 [id number]  Ortho Devices Type of Ortho Device: Lenora BoysWatson Jones splint Ortho Device/Splint Location: put ohf on bed Ortho Device/Splint Interventions: Ordered;Application   Jennye MoccasinHughes, Anthony Craig 08/03/2013, 4:22 PM

## 2013-08-03 NOTE — H&P (Signed)
I have seen and examined the patient. I agree with the findings above.  I discussed with the patient the risks and benefits of surgery for left tibial plateau fracture, including the possibility of infection, nerve injury, vessel injury, wound breakdown, arthritis, symptomatic hardware, DVT/ PE, loss of motion, and need for further surgery among others.  We also specifically discussed the elevated risk of soft tissue breakdown that could lead to amputation.  She understood these risks and wished to proceed.   Budd PalmerHANDY,MICHAEL H, MD 08/03/2013 7:55 AM

## 2013-08-03 NOTE — Op Note (Signed)
NAMLeanne Matthews:  Wardrip, Bhumi            ACCOUNT NO.:  [id number]  MEDICAL RECORD NO.:  [id number]  LOCATION:  [id number]                        FACILITY:  [facility]  PHYSICIAN:  Doralee AlbinoMichael H. Carola FrostHandy, M.D. DATE OF BIRTH:  11-25-1990  DATE OF PROCEDURE:  08/03/2013 DATE OF DISCHARGE:                              OPERATIVE REPORT   PREOPERATIVE DIAGNOSES: 1. Left bicondylar tibial plateau fracture. 2. Tibial eminence fracture. 3. Tibial shaft fracture.  POSTOPERATIVE DIAGNOSES: 1. Left bicondylar tibial plateau fracture. 2. Tibial eminence fracture. 3. Tibial shaft fracture. 4. Complete avulsion of the posterior horn of the lateral meniscus.  PROCEDURE: 1. Open reduction and internal fixation, left bicondylar tibial     plateau. 2. Open reduction and internal fixation of tibial eminence. 3. Open reduction and internal fixation of tibial shaft. 4. Repair of lateral meniscus and arthrotomy. 5. Anterior compartment fasciotomy.  SURGEON:  Doralee AlbinoMichael H. Carola FrostHandy, MD  ASSISTANT:  Mearl LatinKeith W Paul, PA  ANESTHESIA:  General.  COMPLICATIONS:  None.  I/O:  1500 mL crystalloid, UOP 600 mL, EBL 100.  SPECIMENS:  None.  TOURNIQUET:  114 minutes.  DISPOSITION:  To PACU.  CONDITION:  Stable.  BRIEF SUMMARY AND INDICATION FOR PROCEDURE:  Erin Matthews is a 23- year-old, who fell down some stairs sustaining a severely displaced and comminuted  Schatzker 6 type fracture, which involved both sides of the plateau, resulted in severe articular depression of the lateral side, posterior medial displacement, and also a separate tibial eminence segment.  The fracture extended into the shaft making this a Schatzker 6 as well.  The patient and I discussed the risks and benefits of surgical repair including the possibility of infection, nerve injury, vessel injury, DVT, PE, heart attack, stroke, loss of motion, instability and need for further treatment in event of complications.  Furthermore, we discussed the  importance of an aggressive metabolic bone workup and treatment to improve her overall bone health which is significantly impaired in order to have this magnitude of injury.  She did have a low vitamin D on initial laboratory studies as well.  She acknowledged these risks and wished to proceed.  BRIEF SUMMARY OF PROCEDURE:  Ms. Erin Matthews was given 2 g of Ancef, taken to the operating room where general anesthesia was induced.  Her left lower extremity was prepped and draped in usual sterile fashion.  A tourniquet was placed about the thigh but not initially elevated during the procedure.  Again by bringing in C-arm to see if I could obtain a closed reduction with the use of a large K-wire in the posterior medial fragment and derotation of the shaft.  This was not adequately successful in obtaining an anatomic reduction.  Consequently, the incision was extended along the medial side being careful to protect the neurovascular bundle as well as the pes tendons and along the posterior medial aspect of the tibia.  Then, using a combination of the K-wire help of my assistant placing retraction and external rotation, and a ball spike pusher,  we were able to reduce the medial plateau anatomically, pin it provisionally with 3 K-wires.  This was followed by placement of a medial buttress plate that very effectively reduced the metaphysis.  I did  have to change out the screw here as initially, it ended up being slightly long as we produced compression of the plate against the fracture fragment.  I did place additional lock screw into the epiphyseal segment proximally to help hold that in position.  Later, I place a standard screw to achieve compression across the fracture site.  I then performed a standard and a large lateral approach which was curvilinear over Gerdy tubercle.  Dissection was carried to the anterior compartment and the muscle belly swept over as a separate procedure.  I also used  the long Metzenbaum and spread distal to the incision but underneath the skin going 10 cm distally spreading superficial to the anterior compartment fascia and then deep to the anterior compartment fascia pointing the scissor tips away from the superficial peroneal nerve and releasing this fascia.  After the performance of anterior compartment fasciotomy, I then performed an arthrotomy of the plateau.  The bone here was impacted.  I placed 4-0 Prolene sutures to the coronary ligament and was able to reflect the meniscus upward.  At this point, we did go ahead and elevate the leg, exsanguinated it with an Esmarch bandage, and then inflated the tourniquet to 300 mmHg.  A trapdoor was made in the metaphysis and then with the help of x-ray I identified the large articular block of the lateral plateau noted to be severely depressed in the metaphysis. Although I was able to mobilize the segments somewhat, I was not able to reduce it consequently.  Fracture line was traced out and split with an osteotome booking open the fracture site.  I then also facilitated better examination of the lateral meniscus.  Lateral meniscus was completely torn from its posterior horn insertion and had no remaining fibers intact there.  I used 4-0 Prolene sutures with vertical mattress technique to repair the posterior horn.  The articular blocks were then mobilized and elevated to the correct height.  Next, I turned my attention to the tibial eminence which was displaced superiorly.  I placed 2 drill holes into the eminent fragment in the footprint of the ACL and passed a #2 FiberWire through this segment and then backed through and carried this suture through the metaphysis and out the trapdoor where I eventually tied over the plate.  The plate itself was positioned, checked on orthogonal views for correct placement and provisionally and then secured.  I did have to compress across this with the large Darrick Penna  clamps and also had to perform an osteoplasty of the anterolateral corner of the plateau which had been impacted.  I did use a series of poly axial multi-directional screws as well.  Final images showed appropriate alignment, articular reduction along the weightbearing areas.  As I squeezed the fracture, there was a slight elevation of the eminence posteriorly but it remained well reduced under direct visualization anteriorly and the reduction remained excellent on the lateral articular surface as well.  We did maneuver the shaft fragment into alignment and secured the 3 most distal holes in the plate with the middle of those being a bicortical lock.  I had also inserted an additional screw as I said on the medial side that was standard at the articular level to close down that and help compress across that fracture gap as well as a more distal screw in the medial plate.  Again, final images were checked.  The wounds were irrigated thoroughly.  A 5 cc calcium phosphate cement injected in the metaphyseal  area where there was such extensive bone loss, and then the knee joint was irrigated thoroughly.  The coronary ligament was repaired with vertical mattress. Sterile gently compressive dressing was applied.  Knee was examined and was quite stable in full extension and 30 degrees of flexion.  She was placed into a sterile gently compressive dressing and immobilizer and taken to the PACU in stable condition.  Montez MoritaKeith Paul, PA-C assisted me throughout and his assistance was absolutely necessary for this very challenging case and all its component as he was required to maintain reduction, place and remove provisional hardware and also assisted me with simultaneous closure.  PROGNOSIS:  Ms. Erin Matthews will be nonweightbearing with unrestricted range of motion of the knee.  We anticipate a progressive weightbearing at 7-8 weeks.  She will be on vitamin D supplementation and will undergo extensive  education regarding optimizing her bone health with correction of any other identified biologic deficiencies.     Doralee AlbinoMichael H. Carola FrostHandy, M.D.     MHH/MEDQ  D:  08/03/2013  T:  08/03/2013  Job:  [id number]

## 2013-08-03 NOTE — Brief Op Note (Signed)
08/01/2013 - 08/03/2013  1:00 PM  PATIENT:  Erin Matthews  23 y.o. female  PRE-OPERATIVE DIAGNOSIS:   1. Bicondylar Tibial Plateau Fracture 2. Tibial eminence fracture 3. Tibial shaft fracture  POST-OPERATIVE DIAGNOSIS:   1. Bicondylar Tibial Plateau Fracture 2. Tibial eminence fracture 3. Tibial shaft fracture 4. Complete avulsion of posterior horn lateral meniscus  PROCEDURE:  Procedure(s): 1. OPEN REDUCTION INTERNAL FIXATION (ORIF) BICONDYLAR TIBIAL PLATEAU 2. ORIF OF TIBIAL EMINENCE (Left) 3. ORIF TIBIAL SHAFT 4. REPAIR OF LATERAL MENISCUS (Left) 5. ANTERIOR COMPARTMENT FASCIOTOMY (Left)  SURGEON:  Surgeon(s) and Role:    * Budd PalmerMichael H Handy, MD - Primary  PHYSICIAN ASSISTANT: Montez MoritaKeith Paul, PA-C  ANESTHESIA:   general  I/O:  Total I/O In: 1500 [I.V.:1500] Out: 700 [Urine:600; Blood:100]  SPECIMEN:  No Specimen  TOURNIQUET:   Total Tourniquet Time Documented: Thigh (Left) - 114 minutes Total: Thigh (Left) - 114 minutes   DICTATION: .Other Dictation: Dictation Number [id number]

## 2013-08-03 NOTE — Anesthesia Postprocedure Evaluation (Signed)
Anesthesia Post Note  Patient: Erin Matthews  Procedure(s) Performed: Procedure(s) (LRB): OPEN REDUCTION INTERNAL FIXATION (ORIF) TIBIAL PLATEAU WITH REPAIR OF TIBIAL EMMINENCE (Left) REPAIR OF LATERAL MENISCUS (Left) ANTERIOR COMPARTMENT FASCIOTOMY (Left)  Anesthesia type: General  Patient location: PACU  Post pain: Pain level controlled  Post assessment: Patient's Cardiovascular Status Stable  Last Vitals:  Filed Vitals:   08/03/13 1445  BP: 154/84  Pulse: 82  Temp: 36.1 C  Resp: 17    Post vital signs: Reviewed and stable  Level of consciousness: alert  Complications: No apparent anesthesia complications

## 2013-08-04 DIAGNOSIS — E876 Hypokalemia: Secondary | ICD-10-CM

## 2013-08-04 DIAGNOSIS — S82109A Unspecified fracture of upper end of unspecified tibia, initial encounter for closed fracture: Secondary | ICD-10-CM

## 2013-08-04 LAB — CBC
HCT: 31.3 % — ABNORMAL LOW (ref 36.0–46.0)
HEMOGLOBIN: 10.8 g/dL — AB (ref 12.0–15.0)
MCH: 31.5 pg (ref 26.0–34.0)
MCHC: 34.5 g/dL (ref 30.0–36.0)
MCV: 91.3 fL (ref 78.0–100.0)
Platelets: 213 10*3/uL (ref 150–400)
RBC: 3.43 MIL/uL — ABNORMAL LOW (ref 3.87–5.11)
RDW: 11.6 % (ref 11.5–15.5)
WBC: 10.7 10*3/uL — ABNORMAL HIGH (ref 4.0–10.5)

## 2013-08-04 LAB — COMPREHENSIVE METABOLIC PANEL
ALBUMIN: 3.2 g/dL — AB (ref 3.5–5.2)
ALT: 21 U/L (ref 0–35)
AST: 28 U/L (ref 0–37)
Alkaline Phosphatase: 54 U/L (ref 39–117)
BUN: 4 mg/dL — AB (ref 6–23)
CALCIUM: 8.5 mg/dL (ref 8.4–10.5)
CO2: 25 mEq/L (ref 19–32)
Chloride: 102 mEq/L (ref 96–112)
Creatinine, Ser: 0.57 mg/dL (ref 0.50–1.10)
GFR calc non Af Amer: >90 mL/min (ref >90)
GLUCOSE: 116 mg/dL — AB (ref 70–99)
Potassium: 2.8 mEq/L — CL (ref 3.7–5.3)
Sodium: 138 mEq/L (ref 137–147)
TOTAL PROTEIN: 6.4 g/dL (ref 6.0–8.3)
Total Bilirubin: 0.7 mg/dL (ref 0.3–1.2)

## 2013-08-04 LAB — MAGNESIUM
MAGNESIUM: 2.2 mg/dL (ref 1.5–2.5)
Magnesium: 2.3 mg/dL (ref 1.5–2.5)

## 2013-08-04 LAB — PREALBUMIN: Prealbumin: 17.1 mg/dL — ABNORMAL LOW (ref 17.0–34.0)

## 2013-08-04 LAB — TSH: TSH: 1.1 u[IU]/mL (ref 0.350–4.500)

## 2013-08-04 LAB — POTASSIUM: POTASSIUM: 4.2 meq/L (ref 3.7–5.3)

## 2013-08-04 LAB — PHOSPHORUS: Phosphorus: 1.5 mg/dL — ABNORMAL LOW (ref 2.3–4.6)

## 2013-08-04 MED ORDER — POTASSIUM CHLORIDE CRYS ER 10 MEQ PO TBCR
10.0000 meq | EXTENDED_RELEASE_TABLET | Freq: Once | ORAL | Status: AC
  Administered 2013-08-04: 10 meq via ORAL
  Filled 2013-08-04: qty 1

## 2013-08-04 MED ORDER — POTASSIUM CHLORIDE 10 MEQ/100ML IV SOLN
10.0000 meq | INTRAVENOUS | Status: AC
  Administered 2013-08-04 (×3): 10 meq via INTRAVENOUS
  Filled 2013-08-04 (×3): qty 100

## 2013-08-04 MED ORDER — K PHOS MONO-SOD PHOS DI & MONO 155-852-130 MG PO TABS
500.0000 mg | ORAL_TABLET | Freq: Three times a day (TID) | ORAL | Status: AC
  Administered 2013-08-04 (×3): 500 mg via ORAL
  Filled 2013-08-04 (×4): qty 2

## 2013-08-04 MED ORDER — MAGNESIUM SULFATE IN D5W 10-5 MG/ML-% IV SOLN
1.0000 g | Freq: Once | INTRAVENOUS | Status: AC
  Administered 2013-08-04: 1 g via INTRAVENOUS
  Filled 2013-08-04: qty 100

## 2013-08-04 MED ORDER — POTASSIUM CHLORIDE 10 MEQ/100ML IV SOLN
10.0000 meq | INTRAVENOUS | Status: AC
  Administered 2013-08-04: 10 meq via INTRAVENOUS
  Filled 2013-08-04: qty 100

## 2013-08-04 MED ORDER — POTASSIUM CHLORIDE CRYS ER 20 MEQ PO TBCR
40.0000 meq | EXTENDED_RELEASE_TABLET | ORAL | Status: AC
  Administered 2013-08-04 (×2): 40 meq via ORAL
  Filled 2013-08-04 (×2): qty 2

## 2013-08-04 NOTE — Progress Notes (Signed)
Orthopedic Tech Progress Note Patient Details:  Erin SalisburyDesirae N Matthews 05/06/1990 [id number] Advanced answering service called to place brace order Patient ID: Erin Salisburyesirae N Erin Matthews, female   DOB: 09/19/1990, 23 y.o.   MRN: [id number]   [name] 08/04/2013, 10:38 AM

## 2013-08-04 NOTE — Progress Notes (Signed)
CRITICAL VALUE ALERT  Critical value received:  Potassium: 2.8  Date of notification:  08/04/2013   Time of notification:  06:49 AM   Critical value read back: Yes  Nurse who received alert:  Tennis ShipKoko Ogun-Ajala  MD notified (1st page):  Dr. Eulah PontMurphy  Time of first page: 06:55  MD notified (2nd page):   Time of second page: 06:55  Responding MD:  Dr. Eulah PontMurphy  Time MD responded:  06:58

## 2013-08-04 NOTE — Evaluation (Signed)
Physical Therapy Evaluation Patient Details Name: Erin Matthews MRN: [id number] DOB: 10/16/1990 Today's Date: 08/04/2013   History of Present Illness  Erin Matthews  is a 23 y.o. female, no significant past medical history, history of ankle surgery as a child who was admitted after a mechanical fall causing left tibial fracture, was admitted by orthopedics in underwent ORIF on 08/03/2013,  Clinical Impression  Pt admitted with mechanical fall resulting in left tibia plateau. Pt currently with functional limitations due to the deficits listed below (see PT Problem List).  Pt able to complete gait with use RW however would like to try crutches attempted crutches however crutches not correct fit.  Will continue to assess RW vs crutches.  However recommend using RW for overall stability for pt at this time.   Pt will benefit from skilled PT to increase their independence and safety with mobility to allow discharge to the venue listed below.     Follow Up Recommendations No PT follow up;Supervision/Assistance - 24 hour    Equipment Recommendations  Rolling walker with 5" wheels (vs crutches (will continue to assess))    Recommendations for Other Services       Precautions / Restrictions Precautions Precautions: Other (comment) (Pt to wear brace at all times) Restrictions Weight Bearing Restrictions: Yes LLE Weight Bearing: Non weight bearing Other Position/Activity Restrictions: Pt to wear brace at all times       Mobility  Bed Mobility                  Transfers Overall transfer level: Needs assistance   Transfers: Sit to/from Stand Sit to Stand: Min assist         General transfer comment: (A) to initaite transfer with cues for LE and UE placement and to maintain nonweightbearing LE.  Ambulation/Gait Ambulation/Gait assistance: Min assist Ambulation Distance (Feet): 15 Feet Assistive device: Rolling walker (2 wheeled) Gait Pattern/deviations: Step-to  pattern     General Gait Details: Attempted to use crutches however crutches not a correct fit therefore used RW.  Pt able to use RW with minimal assistance for placement and to maintain balance.   Stairs            Wheelchair Mobility    Modified Rankin (Stroke Patients Only)       Balance                                             Pertinent Vitals/Pain 4/10 left knee pain    Home Living Family/patient expects to be discharged to:: Private residence Living Arrangements: Non-relatives/Friends Available Help at Discharge: Family;Available 24 hours/day Type of Home: House Home Access: Stairs to enter Entrance Stairs-Rails: Doctor, general practiceight;Left Entrance Stairs-Number of Steps: 4 Home Layout: One level Home Equipment: None      Prior Function Level of Independence: Independent               Hand Dominance   Dominant Hand: Left    Extremity/Trunk Assessment               Lower Extremity Assessment: Overall WFL for tasks assessed;LLE deficits/detail   LLE Deficits / Details: Limited ROM due to fracture location.  Pt to wear brace at all times.      Communication   Communication: No difficulties  Cognition Arousal/Alertness: Awake/alert Behavior During Therapy: WFL for tasks assessed/performed Overall Cognitive Status: Within  Functional Limits for tasks assessed                      General Comments      Exercises        Assessment/Plan    PT Assessment Patient needs continued PT services  PT Diagnosis Difficulty walking;Generalized weakness;Acute pain   PT Problem List Decreased strength;Decreased activity tolerance;Decreased balance;Decreased mobility;Decreased knowledge of use of DME;Decreased safety awareness;Decreased knowledge of precautions;Pain  PT Treatment Interventions DME instruction;Gait training;Stair training;Functional mobility training;Therapeutic activities;Therapeutic exercise;Balance  training;Patient/family education   PT Goals (Current goals can be found in the Care Plan section) Acute Rehab PT Goals Patient Stated Goal: To go home and get back to work PT Goal Formulation: With patient Time For Goal Achievement: 08/11/13 Potential to Achieve Goals: Good    Frequency Min 5X/week   Barriers to discharge        Co-evaluation               End of Session Equipment Utilized During Treatment: Gait belt;Left knee immobilizer Activity Tolerance: Patient tolerated treatment well Patient left: in chair;with call bell/phone within reach Nurse Communication: Mobility status;Weight bearing status;Precautions         Time: [id number] PT Time Calculation (min): 41 min   Charges:   PT Evaluation $Initial PT Evaluation Tier I: 1 Procedure PT Treatments $Gait Training: 8-22 mins $Therapeutic Activity: 8-22 mins   PT G Codes:          Whyatt Klinger 08/04/2013, 1:13 PM  Jake SharkWendy Candis Kabel, PT DPT [phone]

## 2013-08-04 NOTE — Progress Notes (Signed)
Pt complained of numbness and tingling. Assessed pt foot cool and dry to touch. Pt has movement and sensation of all toes.  Capillary refill less than 2 seconds. Leg elevated and and ice re-applied. Hemovac functioning properly. At 0200 pt stated that her leg no longer was numb and tingling sensation went away. Will continue to monitor for complaints of numbness and tingling.

## 2013-08-04 NOTE — Evaluation (Signed)
Occupational Therapy Evaluation  Patient Details Name: Erin SalisburyDesirae N Sartin MRN: [id number] DOB: 12/11/1990 Today's Date: 08/04/2013    History of Present Illness Huberta Deloria LairHamilton  is a 23 y.o. female, no significant past medical history, history of ankle surgery as a child who was admitted after a mechanical fall causing left tibial fracture, was admitted by orthopedics in underwent ORIF on 08/03/2013,   Clinical Impression   This 23 yo female admitted and underwent above presents to acute OT with decreased AROM LLE, increased pain LLE, decreased balance all affecting pt's ability to care for herself. She will benefit from acute OT without need for follow up.    Follow Up Recommendations  No OT follow up    Equipment Recommendations  3 in 1 bedside comode       Precautions / Restrictions Precautions Precautions: Fall Required Braces or Orthoses: Other Brace/Splint Other Brace/Splint: Bledsoe brace (off in bed for bathing) Restrictions Weight Bearing Restrictions: Yes LLE Weight Bearing: Non weight bearing Other Position/Activity Restrictions: Pt to wear brace at all times       Mobility Bed Mobility Overal bed mobility: Needs Assistance Bed Mobility: Sit to Supine       Sit to supine: Min assist (for LLE)      Transfers Overall transfer level: Needs assistance   Transfers: Sit to/from Stand Sit to Stand: Min guard         General transfer comment: VCs for safe hand placement    Balance Overall balance assessment: Needs assistance Sitting-balance support: Feet supported;No upper extremity supported Sitting balance-Leahy Scale: Good     Standing balance support: Single extremity supported;During functional activity Standing balance-Leahy Scale: Poor                              ADL Overall ADL's : Needs assistance/impaired Eating/Feeding: Independent;Sitting   Grooming: Wash/dry hands;Min guard;Standing   Upper Body Bathing: Set up;Sitting    Lower Body Bathing: Maximal assistance;Sit to/from stand   Upper Body Dressing : Set up;Sitting   Lower Body Dressing: Maximal assistance;Sit to/from stand   Toilet Transfer: Min guard;Ambulation;Regular Toilet;Grab bars   Toileting- Clothing Manipulation and Hygiene: Independent;Sitting/lateral lean       Functional mobility during ADLs: Min guard                 Pertinent Vitals/Pain 2/10 with movement; repositioned and made NT aware that pt needed new ice for her ice bag     Hand Dominance Left   Extremity/Trunk Assessment Upper Extremity Assessment Upper Extremity Assessment: Overall WFL for tasks assessed         Communication Communication Communication: No difficulties   Cognition Arousal/Alertness: Awake/alert Behavior During Therapy: WFL for tasks assessed/performed Overall Cognitive Status: Within Functional Limits for tasks assessed                                Home Living Family/patient expects to be discharged to:: Private residence Living Arrangements: Non-relatives/Friends Available Help at Discharge: Family;Available 24 hours/day Type of Home: House Home Access: Stairs to enter Entergy CorporationEntrance Stairs-Number of Steps: 4 Entrance Stairs-Rails: Right;Left Home Layout: One level               Home Equipment: None          Prior Functioning/Environment Level of Independence: Independent             OT  Diagnosis: Generalized weakness;Acute pain   OT Problem List: Decreased range of motion;Impaired balance (sitting and/or standing);Decreased knowledge of use of DME or AE;Pain   OT Treatment/Interventions: Self-care/ADL training;Patient/family education;Balance training;DME and/or AE instruction    OT Goals(Current goals can be found in the care plan section) Acute Rehab OT Goals Patient Stated Goal: To go home and get back to work OT Goal Formulation: With patient Time For Goal Achievement: 08/11/13 Potential to Achieve  Goals: Good  OT Frequency: Min 2X/week              End of Session Equipment Utilized During Treatment: Rolling walker;Left knee immobilizer (rep in at end of session to put Bledsoe brace on) Nurse Communication:  (NT: pt needed new ice for ice bag and wants to get washed up since foley just came out)  Activity Tolerance: Patient tolerated treatment well Patient left: in bed;with call bell/phone within reach   Time: 1310-1330 OT Time Calculation (min): 20 min Charges:  OT General Charges $OT Visit: 1 Procedure OT Evaluation $Initial OT Evaluation Tier I: 1 Procedure OT Treatments $Self Care/Home Management : 8-22 mins  Evette GeorgesLeonard, Catherine Eva [phone] 08/04/2013, 2:04 PM

## 2013-08-04 NOTE — Progress Notes (Signed)
Subjective:  Patient reports pain as mild  Objective:   VITALS:   Filed Vitals:   08/04/13 0127 08/04/13 0146 08/04/13 0400 08/04/13 0609  BP: 118/68   106/69  Pulse: 82   92  Temp: 97.5 F (36.4 C)   98 F (36.7 C)  TempSrc: Oral   Oral  Resp: 14 14 12 14   Height:      Weight:      SpO2: 100% 100% 100% 100%    Physical Exam  Dressing: C/D/I  Compartments soft  SILT DP/SP/S/S/T, 2+DP, +TA/GS/EHL  LABS  Results for orders placed during the hospital encounter of 08/01/13 (from the past 24 hour(s))  CBC     Status: Abnormal   Collection Time    08/03/13  3:45 PM      Result Value Ref Range   WBC 9.6  4.0 - 10.5 K/uL   RBC 3.79 (*) 3.87 - 5.11 MIL/uL   Hemoglobin 12.2  12.0 - 15.0 g/dL   HCT 09.835.0 (*) 11.936.0 - 14.746.0 %   MCV 92.3  78.0 - 100.0 fL   MCH 32.2  26.0 - 34.0 pg   MCHC 34.9  30.0 - 36.0 g/dL   RDW 82.911.8  56.211.5 - 13.015.5 %   Platelets 206  150 - 400 K/uL  CREATININE, SERUM     Status: None   Collection Time    08/03/13  3:45 PM      Result Value Ref Range   Creatinine, Ser 0.64  0.50 - 1.10 mg/dL   GFR calc non Af Amer >90  >90 mL/min   GFR calc Af Amer >90  >90 mL/min  PHOSPHORUS     Status: Abnormal   Collection Time    08/04/13  5:52 AM      Result Value Ref Range   Phosphorus 1.5 (*) 2.3 - 4.6 mg/dL  MAGNESIUM     Status: None   Collection Time    08/04/13  5:52 AM      Result Value Ref Range   Magnesium 2.2  1.5 - 2.5 mg/dL  TSH     Status: None   Collection Time    08/04/13  5:52 AM      Result Value Ref Range   TSH 1.100  0.350 - 4.500 uIU/mL  CBC     Status: Abnormal   Collection Time    08/04/13  5:52 AM      Result Value Ref Range   WBC 10.7 (*) 4.0 - 10.5 K/uL   RBC 3.43 (*) 3.87 - 5.11 MIL/uL   Hemoglobin 10.8 (*) 12.0 - 15.0 g/dL   HCT 86.531.3 (*) 78.436.0 - 69.646.0 %   MCV 91.3  78.0 - 100.0 fL   MCH 31.5  26.0 - 34.0 pg   MCHC 34.5  30.0 - 36.0 g/dL   RDW 29.511.6  28.411.5 - 13.215.5 %   Platelets 213  150 - 400 K/uL  COMPREHENSIVE METABOLIC  PANEL     Status: Abnormal   Collection Time    08/04/13  5:52 AM      Result Value Ref Range   Sodium 138  137 - 147 mEq/L   Potassium 2.8 (*) 3.7 - 5.3 mEq/L   Chloride 102  96 - 112 mEq/L   CO2 25  19 - 32 mEq/L   Glucose, Bld 116 (*) 70 - 99 mg/dL   BUN 4 (*) 6 - 23 mg/dL   Creatinine, Ser 4.400.57  0.50 - 1.10  mg/dL   Calcium 8.5  8.4 - 27.210.5 mg/dL   Total Protein 6.4  6.0 - 8.3 g/dL   Albumin 3.2 (*) 3.5 - 5.2 g/dL   AST 28  0 - 37 U/L   ALT 21  0 - 35 U/L   Alkaline Phosphatase 54  39 - 117 U/L   Total Bilirubin 0.7  0.3 - 1.2 mg/dL   GFR calc non Af Amer >90  >90 mL/min   GFR calc Af Amer >90  >90 mL/min     Assessment/Plan: 1 Day Post-Op   Principal Problem:   Tibial plateau fracture, left Active Problems:   Fracture of left tibial plateau   Unspecified vitamin D deficiency   Nicotine dependence   PLAN:  Critical K and phos, I have consulted the hospitalist to help in replacement and they have begun this.  Weight Bearing: NWB LLE Dressings: Keep C/D/I until sunday VTE prophylaxis: Lovenox 40 Daily, SCD Dispo: pending   MURPHY, TIMOTHY, D 08/04/2013, 9:11 AM   Margarita Ranaimothy Murphy, MD Cell [phone]

## 2013-08-04 NOTE — Consult Note (Addendum)
Patient Demographics  Erin Matthews, is a 23 y.o. female   MRN: [id number]   DOB - 07-Oct-1990  Admit Date - 08/01/2013    Outpatient Primary MD for the patient is Default, Provider, MD  Consult requested in the Hospital by Budd Palmer, MD, On 08/04/2013    Reason for consult low potassium and low phosphorus   With History of -  Past Medical History  Diagnosis Date  . Nicotine dependence 08/02/2013  . Tibial plateau fracture, left 08/02/2013  . Unspecified vitamin D deficiency 08/02/2013      History reviewed. No pertinent past surgical history.  in for   Chief Complaint  Patient presents with  . Fall  . Knee Pain     HPI  Erin Matthews  is a 23 y.o. female, no significant past medical history, history of ankle surgery as a child who was admitted after a mechanical fall causing left tibial fracture, was admitted by orthopedics in underwent surgical correction on 08/03/2013, patient was doing well however this morning on postop op lab work she was noted to have a low potassium and low phosphorus and medicine was consulted for the same. Patient is completely symptom free except for some left leg postop pain and discomfort.    Review of Systems    In addition to the HPI above,   No Fever-chills, No Headache, No changes with Vision or hearing, No problems swallowing food or Liquids, No Chest pain, Cough or Shortness of Breath, No Abdominal pain, No Nausea or Vommitting, Bowel movements are regular, No Blood in stool or Urine, No dysuria, No new skin rashes or bruises, No new joints pains-aches, except some left leg postop pain No new weakness, tingling, numbness in any extremity, No recent weight gain or loss, No polyuria, polydypsia or polyphagia, No significant Mental Stressors.  A full 10 point Review  of Systems was done, except as stated above, all other Review of Systems were negative.   Social History History  Substance Use Topics  . Smoking status: Current Every Day Smoker -- 0.75 packs/day    Types: Cigarettes  . Smokeless tobacco: Not on file  . Alcohol Use: Yes     Comment: social       Family History No history of CAD or DM  Prior to Admission medications   Not on File    Anti-infectives   Start     Dose/Rate Route Frequency Ordered Stop   08/03/13 1830  ceFAZolin (ANCEF) IVPB 1 g/50 mL premix     1 g 100 mL/hr over 30 Minutes Intravenous Every 6 hours 08/03/13 1427 08/04/13 0610   08/03/13 1100  [MAR Hold]  ceFAZolin (ANCEF) IVPB 2 g/50 mL premix     (On MAR Hold since 08/03/13 0700)   2 g 100 mL/hr over 30 Minutes Intravenous  Once 08/02/13 0951 08/03/13 1223      Scheduled Meds: . acetaminophen  1,000 mg Intravenous 4 times per day  . docusate sodium  100 mg Oral  BID  . enoxaparin (LOVENOX) injection  40 mg Subcutaneous Q24H  . HYDROmorphone PCA 0.3 mg/mL   Intravenous 6 times per day  . magnesium sulfate 1 - 4 g bolus IVPB  1 g Intravenous Once  . phosphorus  500 mg Oral TID  . potassium chloride  10 mEq Intravenous Q1 Hr x 4  . potassium chloride  40 mEq Oral Q4H  . senna  1 tablet Oral BID  . Vitamin D (Ergocalciferol)  50,000 Units Oral Q7 days   Continuous Infusions: . sodium chloride 50 mL/hr at 08/03/13 1822   PRN Meds:.diphenhydrAMINE, diphenhydrAMINE, diphenhydrAMINE, methocarbamol (ROBAXIN) IV, methocarbamol, metoCLOPramide (REGLAN) injection, metoCLOPramide, naloxone, ondansetron (ZOFRAN) IV, ondansetron, oxyCODONE, polyethylene glycol, sodium chloride, sorbitol  No Known Allergies  Physical Exam  Vitals  Blood pressure 106/69, pulse 92, temperature 98 F (36.7 C), temperature source Oral, resp. rate 14, height 5' (1.524 m), weight 52.028 kg (114 lb 11.2 oz), SpO2 100.00%.   1. General Young African American female lying in bed in  NAD,    2. Normal affect and insight, Not Suicidal or Homicidal, Awake Alert, Oriented X 3.  3. No F.N deficits, ALL C.Nerves Intact, Strength 5/5 all 4 extremities, Sensation intact all 4 extremities, Plantars down going.  4. Ears and Eyes appear Normal, Conjunctivae clear, PERRLA. Moist Oral Mucosa.  5. Supple Neck, No JVD, No cervical lymphadenopathy appriciated, No Carotid Bruits.  6. Symmetrical Chest wall movement, Good air movement bilaterally, CTAB.  7. RRR, No Gallops, Rubs or Murmurs, No Parasternal Heave.  8. Positive Bowel Sounds, Abdomen Soft, No tenderness, No organomegaly appriciated,No rebound -guarding or rigidity.  9.  No Cyanosis, Normal Skin Turgor, No Skin Rash or Bruise. Left leg under bandage  10. Good muscle tone,  joints appear normal , no effusions, Normal ROM.  11. No Palpable Lymph Nodes in Neck or Axillae     Data Review  CBC  Recent Labs Lab 08/01/13 2348 08/03/13 1545 08/04/13 0552  WBC 9.2 9.6 10.7*  HGB 13.7 12.2 10.8*  HCT 39.5 35.0* 31.3*  PLT 295 206 213  MCV 92.3 92.3 91.3  MCH 32.0 32.2 31.5  MCHC 34.7 34.9 34.5  RDW 12.0 11.8 11.6   ------------------------------------------------------------------------------------------------------------------  Chemistries   Recent Labs Lab 08/01/13 2348 08/03/13 1545 08/04/13 0552  NA 139  --  138  K 3.2*  --  2.8*  CL 102  --  102  CO2 21  --  25  GLUCOSE 122*  --  116*  BUN 9  --  4*  CREATININE 0.75 0.64 0.57  CALCIUM 9.4  --  8.5  MG  --   --  2.2  AST  --   --  28  ALT  --   --  21  ALKPHOS  --   --  54  BILITOT  --   --  0.7   ------------------------------------------------------------------------------------------------------------------ estimated creatinine clearance is 78.6 ml/min (by C-G formula based on Cr of 0.57). ------------------------------------------------------------------------------------------------------------------  Recent Labs  08/04/13 0552    TSH 1.100     Coagulation profile  Recent Labs Lab 08/02/13 0109  INR 1.13   ------------------------------------------------------------------------------------------------------------------- No results found for this basename: DDIMER,  in the last 72 hours -------------------------------------------------------------------------------------------------------------------  Cardiac Enzymes No results found for this basename: CK, CKMB, TROPONINI, MYOGLOBIN,  in the last 168 hours ------------------------------------------------------------------------------------------------------------------ No components found with this basename: POCBNP,    ---------------------------------------------------------------------------------------------------------------  Urinalysis    Component Value Date/Time   COLORURINE YELLOW 08/02/2013 [id number]  APPEARANCEUR CLEAR 08/02/2013 0512   LABSPEC 1.017 08/02/2013 0512   PHURINE 7.0 08/02/2013 0512   GLUCOSEU NEGATIVE 08/02/2013 0512   HGBUR TRACE* 08/02/2013 0512   BILIRUBINUR NEGATIVE 08/02/2013 0512   KETONESUR 15* 08/02/2013 0512   PROTEINUR NEGATIVE 08/02/2013 0512   UROBILINOGEN 1.0 08/02/2013 0512   NITRITE NEGATIVE 08/02/2013 0512   LEUKOCYTESUR NEGATIVE 08/02/2013 0512     Imaging results:   Dg Chest 1 View  08/02/2013   CLINICAL DATA:  Preoperative knee surgery  EXAM: CHEST - 1 VIEW  COMPARISON:  January 17, 2010  FINDINGS: Lungs are clear. Heart size and pulmonary vascularity are normal. No adenopathy. No bone lesions.  IMPRESSION: No abnormality noted.   Electronically Signed   By: Bretta BangWilliam  Woodruff M.D.   On: 08/02/2013 09:24   Ct Knee Left Wo Contrast  08/02/2013   CLINICAL DATA:  Fall.  Knee pain.  Tibial plateau fracture.  EXAM: CT OF THE Left KNEE WITHOUT CONTRAST  TECHNIQUE: Multidetector CT imaging of the left knee was performed according to the standard protocol. Multiplanar CT image reconstructions were also generated.  COMPARISON:   Radiographs yesterday.  FINDINGS: Comminuted depressed tibial plateau fracture is present involving both tibial condyles. Additionally, there is a impaction fracture of the proximal fibular head adjacent to the proximal tibia-fibular joint. There is a large bone fragment containing the tibial eminence. Marked depression and impaction of the lateral tibial condyle weight-bearing surface, nearly to the level of the lateral tibial metaphyseal curve on coronal reconstructed images. Maximal depression is 25 mm in the lateral plateau. The medial plateau is comminuted but mildly displaced with mild depression estimated at 5 mm. Fracture planes extend into the metaphysis.  Fibular shaft appears intact. Longitudinal fracture plane through the tibial diaphysis extends about 9 cm distally the to the level of the prominent vascular channel. The patella and distal femur appear intact.  IMPRESSION: Schatzker 6 bicondylar tibial plateau fracture. Marked depression and rotation of lateral tibial condyle articular surface fragment. Impaction fracture of the fibular head adjacent to the proximal tibia-fibular joint   Electronically Signed   By: Andreas NewportGeoffrey  Lamke M.D.   On: 08/02/2013 01:36   Ct 3d Independent Annabell SabalWkst  08/02/2013   CLINICAL DATA:  Nonspecific (abnormal) findings on radiological and other examination of musculoskeletal system. Tibial plateau fracture.  EXAM: 3-DIMENSIONAL CT IMAGE RENDERING ON INDEPENDENT WORKSTATION  TECHNIQUE: 3-dimensional CT images were rendered by post-processing of the original CT data on an independent workstation. The 3-dimensional CT images were interpreted and findings were reported in the accompanying complete CT report for this study  COMPARISON:  None.  FINDINGS: Re- demonstrated is a comminuted, depressed tibial plateau fracture involving both tibial condyles. Impaction fracture of the proximal fibular head also noted. There is a large osseous fragment containing the tibial eminence. Marked  depression of the lateral tibial plateau.  IMPRESSION: 3D - SSD images of the left knee demonstrating a comminuted and depressed tibial plateau fracture involving both tibial condyles. Please refer to the CT of the knee for a more detailed report.   Electronically Signed   By: Elige KoHetal  Patel   On: 08/02/2013 15:53   Dg Knee Complete 4 Views Left  08/03/2013   CLINICAL DATA:  Tibial plateau fracture  EXAM: DG C-ARM 61-120 MIN; LEFT KNEE - COMPLETE 4+ VIEW  FLUOROSCOPY TIME:  1 min 34 seconds  COMPARISON:  08/02/2013  FINDINGS: Multiple spot films were obtained intraoperatively. A fixation sideplate was placed both medially and laterally with  multiple fixation screws. The fracture fragments are in near anatomic alignment.   Electronically Signed   By: Alcide Clever M.D.   On: 08/03/2013 12:35   Dg Knee Complete 4 Views Left  08/01/2013   CLINICAL DATA:  Fall.  Left knee pain.  EXAM: LEFT KNEE - COMPLETE 4+ VIEW  COMPARISON:  None.  FINDINGS: Bicondylar tibial plateau fracture is present extending into the metaphysis. Depression of the medial plateau. Lipohemarthrosis evident on the lateral view. Based on metaphyseal extension, this is most compatible with Schatzker 6 fracture. Distal femur appears intact. Patella appears intact.  IMPRESSION: Bicondylar tibial plateau fracture with meta diaphyseal extension most compatible with Schatzker 6.   Electronically Signed   By: Andreas Newport M.D.   On: 08/01/2013 23:57   Dg Knee Left Port  08/03/2013   CLINICAL DATA:  post op  EXAM: PORTABLE LEFT KNEE - 1-2 VIEW  COMPARISON:  Knee CT dated 08/02/2013  FINDINGS: Open reduction internal fixation comminuted tibial plateau fracture. Lateral buttress plate and cannulated screw fixation appears intact. There is near anatomic alignment. Native osseous structures otherwise unremarkable. Postsurgical changes within the surrounding soft tissues including surgical drains.  IMPRESSION: Open reduction internal fixation tibial  plateau fracture.   Electronically Signed   By: Salome Holmes M.D.   On: 08/03/2013 15:11   Dg C-arm 61-120 Min  08/03/2013   CLINICAL DATA:  Tibial plateau fracture  EXAM: DG C-ARM 61-120 MIN; LEFT KNEE - COMPLETE 4+ VIEW  FLUOROSCOPY TIME:  1 min 34 seconds  COMPARISON:  08/02/2013  FINDINGS: Multiple spot films were obtained intraoperatively. A fixation sideplate was placed both medially and laterally with multiple fixation screws. The fracture fragments are in near anatomic alignment.   Electronically Signed   By: Alcide Clever M.D.   On: 08/03/2013 12:35         Assessment & Plan  1. Mechanical fall with left tibial plateau fracture. Management per primary team which is orthopedics. Seems to have tolerated surgical correction on 08/03/2013 well.    2. Low potassium and phosphorus. Will be replaced IV  & PO, will monitor potassium phosphorus and magnesium closely and replete as needed.    3. History of smoking. Counseled to quit     DVT Prophylaxis per primary team  AM Labs Ordered, also please review Full Orders  Family Communication: Plan discussed with patient and patient   Thank you for the consult, we will follow the patient with you in the Hospital.   Susa Raring K M.D on 08/04/2013 at 9:15 AM  Between 7am to 7pm - Pager - [phone]  After 7pm go to [email or web address] - password [email or web address]  And look for the night coverage person covering me after hours   Thank you for the consult, we will follow the patient with you in the Hospital.   [facility] Office  [phone]   **Disclaimer: This note may have been dictated with voice recognition software. Similar sounding words can inadvertently be transcribed and this note may contain transcription errors which may not have been corrected upon publication of note.**

## 2013-08-05 LAB — BASIC METABOLIC PANEL
BUN: 4 mg/dL — ABNORMAL LOW (ref 6–23)
CHLORIDE: 98 meq/L (ref 96–112)
CO2: 23 meq/L (ref 19–32)
Calcium: 9.4 mg/dL (ref 8.4–10.5)
Creatinine, Ser: 0.57 mg/dL (ref 0.50–1.10)
GFR calc Af Amer: >90 mL/min (ref >90)
GFR calc non Af Amer: >90 mL/min (ref >90)
Glucose, Bld: 105 mg/dL — ABNORMAL HIGH (ref 70–99)
POTASSIUM: 4.3 meq/L (ref 3.7–5.3)
SODIUM: 136 meq/L — AB (ref 137–147)

## 2013-08-05 LAB — PHOSPHORUS: Phosphorus: 3.8 mg/dL (ref 2.3–4.6)

## 2013-08-05 LAB — CBC
HCT: 34.3 % — ABNORMAL LOW (ref 36.0–46.0)
HEMOGLOBIN: 11.7 g/dL — AB (ref 12.0–15.0)
MCH: 31.9 pg (ref 26.0–34.0)
MCHC: 34.1 g/dL (ref 30.0–36.0)
MCV: 93.5 fL (ref 78.0–100.0)
Platelets: 229 10*3/uL (ref 150–400)
RBC: 3.67 MIL/uL — AB (ref 3.87–5.11)
RDW: 12.2 % (ref 11.5–15.5)
WBC: 8.3 10*3/uL (ref 4.0–10.5)

## 2013-08-05 LAB — MAGNESIUM: MAGNESIUM: 1.8 mg/dL (ref 1.5–2.5)

## 2013-08-05 MED ORDER — HYDROMORPHONE HCL PF 1 MG/ML IJ SOLN
1.0000 mg | INTRAMUSCULAR | Status: DC | PRN
  Administered 2013-08-05 – 2013-08-06 (×5): 1 mg via INTRAVENOUS
  Filled 2013-08-05 (×5): qty 1

## 2013-08-05 NOTE — Progress Notes (Addendum)
Occupational Therapy Treatment Patient Details Name: Erin SalisburyDesirae N Matthews MRN: [id number] DOB: 12/30/1990 Today's Date: 08/05/2013    History of present illness Erin Matthews  is a 23 y.o. female, no significant past medical history, history of ankle surgery as a child who was admitted after a mechanical fall causing left tibial fracture, was admitted by orthopedics in underwent ORIF on 08/03/2013,   OT comments  Practiced with AE and educated on tub transfer techniques/DME. Plan to practice more prior to d/c.   Follow Up Recommendations  No OT follow up    Equipment Recommendations  3 in 1 bedside comode;tub shower bench   Recommendations for Other Services      Precautions / Restrictions Precautions Precautions: Fall Required Braces or Orthoses: Other Brace/Splint Other Brace/Splint: Bledsoe brace (off in bed for bathing) Restrictions Weight Bearing Restrictions: Yes LLE Weight Bearing: Non weight bearing       Mobility Bed Mobility Overal bed mobility: Needs Assistance Bed Mobility: Sit to supine     Sit to Supine: Min assist     General bed mobility comments: Educated on use of sheet to help with LLE-OT helped position but pt able to move leg on her own.  Transfers Overall transfer level: Needs assistance Equipment used: Rolling walker (2 wheeled) Transfers: Sit to/from Stand Sit to Stand: Min guard         General transfer comment: cues for technique.    Balance                                   ADL Overall ADL's : Needs assistance/impaired     Grooming: Wash/dry hands;Standing;Supervision/safety               Lower Body Dressing: Sit to/from stand;With adaptive equipment;Moderate assistance   Toilet Transfer: Min guard;Ambulation;RW (3 in 1 over commode)   Toileting- Clothing Manipulation and Hygiene: Min guard;Sit to/from stand       Functional mobility during ADLs: Min guard;Rolling walker General ADL Comments: Educated  on AE for LB ADLs and pt practiced (discussed tip of using baby powder to help with getting sock on easier). Recommended sitting for LB ADLs. Educated on use of bag on walker. [facility]. Gave pt tub transfer bench handout and explained tub bench/transfer, but pt not wanting to buy a bench. explained how to use 3 in 1 for tub transfer.      Vision                     Perception     Praxis      Cognition   Behavior During Therapy: [facility] LLCWFL for tasks assessed/performed Overall Cognitive Status: Within Functional Limits for tasks assessed                       Extremity/Trunk Assessment               Exercises     Shoulder Instructions       General Comments      Pertinent Vitals/ Pain       Pain 5/10. Repositioned bed.   Home Living                                          Prior Functioning/Environment  Frequency Min 2X/week     Progress Toward Goals  OT Goals(current goals can now be found in the care plan section)  Progress towards OT goals: Progressing toward goals  Acute Rehab OT Goals Patient Stated Goal: not stated OT Goal Formulation: With patient Time For Goal Achievement: 08/11/13 Potential to Achieve Goals: Good ADL Goals Pt Will Perform Lower Body Dressing: with set-up;with supervision;with adaptive equipment;sit to/from stand Additional ADL Goal #1: Pt will be aware of what tub equipment recommendations are so she/mom can look for what they need  Plan Discharge plan remains appropriate    Co-evaluation                 End of Session Equipment Utilized During Treatment: Gait belt;Rolling walker;Other (comment) (bledsoe brace)   Activity Tolerance Patient tolerated treatment well   Patient Left in bed;with call bell/phone within reach   Nurse Communication          Time: [id number] OT Time Calculation (min): 32 min  Charges: OT General Charges $OT Visit: 1 Procedure OT  Treatments $Self Care/Home Management : 23-37 mins  Earlie RavelingStraub, Lindsey L OTR/L [phone] 08/05/2013, 4:22 PM

## 2013-08-05 NOTE — Progress Notes (Signed)
[facility]  Consulted to correct hypokalemia.  Patient's potassium normalized with replacement. She has been evaluated today- eating well, no diarrhea and not on diuretics, therefore should not have any reason for further hypokalemia.  We will sign off. Please call again if needed.   Calvert CantorSaima Rizwan, MD

## 2013-08-05 NOTE — Progress Notes (Signed)
     Subjective:  Patient reports pain as moderate.  Having hallucinations with PCA.  Objective:   VITALS:   Filed Vitals:   08/05/13 0217 08/05/13 0433 08/05/13 0614 08/05/13 0810  BP: 123/79  122/76   Pulse: 98  86   Temp: 98.9 F (37.2 C)  98.2 F (36.8 C)   TempSrc:      Resp: 16 17 16 21   Height:      Weight:      SpO2: 99% 100% 100% 100%    Neurologically intact Dorsiflexion/Plantar flexion intact Incision: no drainage Drain dc'd, dressing changed.  Brace reapplied.    Lab Results  Component Value Date   WBC 8.3 08/05/2013   HGB 11.7* 08/05/2013   HCT 34.3* 08/05/2013   MCV 93.5 08/05/2013   PLT 229 08/05/2013     Assessment/Plan: 2 Days Post-Op   Principal Problem:   Tibial plateau fracture, left Active Problems:   Fracture of left tibial plateau   Unspecified vitamin D deficiency   Nicotine dependence   Hypokalemia   Hypophosphatemia   Advance diet Up with therapy Plan for discharge tomorrow DC pca, dilaudid iv written, NWB with PT.     Marcelle Hepner P 08/05/2013, 9:32 AM   Teryl LucyJoshua Gibril Mastro, MD Cell [phone]

## 2013-08-05 NOTE — Progress Notes (Addendum)
Physical Therapy Treatment Patient Details Name: Erin SalisburyDesirae N Matthews MRN: [id number] DOB: 04/20/1990 Today's Date: 08/05/2013    History of Present Illness Erin Matthews  is a 23 y.o. female, no significant past medical history, history of ankle surgery as a child who was admitted after a mechanical fall causing left tibial fracture, was admitted by orthopedics in underwent ORIF on 08/03/2013,    PT Comments    Making progress with mobility; using crutches relatively well; Will plan to continue crutch training with PT and perform stair trainging next session  Follow Up Recommendations  No PT follow up;Supervision/Assistance - 24 hour     Equipment Recommendations  Crutches;Rolling walker with 5" wheels (will continue to assess)    Recommendations for Other Services       Precautions / Restrictions Precautions Precautions: Fall Required Braces or Orthoses: Other Brace/Splint Other Brace/Splint: Bledsoe brace (off in bed for bathing) Restrictions LLE Weight Bearing: Non weight bearing Other Position/Activity Restrictions: Pt to wear brace at all times     Mobility  Bed Mobility Overal bed mobility: Needs Assistance Bed Mobility: Supine to Sit     Supine to sit: Min assist     General bed mobility comments: Mina sssist for LLE support; Cues for technique  Transfers Overall transfer level: Needs assistance Equipment used: Crutches Transfers: Sit to/from Stand Sit to Stand: Min guard;Mod assist         General transfer comment: Verbal and demo cues for hand placement, safety, and crutch management; Good sit to stand, with minguard mostly for safety and steadying; Decr control with stand to sit first rep; Much improved second rep with step-by-step cues for crutch management  Ambulation/Gait Ambulation/Gait assistance: Min guard Ambulation Distance (Feet): 40 Feet (20 plus 20) Assistive device: Crutches Gait Pattern/deviations: Step-to pattern;Step-through pattern      General Gait Details: Verbal and demo cues for cruthch use; Able to take qutie good steps; distance limited by decr activity tolerance   Stairs Stairs:  (Briefly discussed technique)          Wheelchair Mobility    Modified Rankin (Stroke Patients Only) Modified Rankin (Stroke Patients Only) Pre-Morbid Rankin Score:  (Comment (F6 and I will))     Balance Overall balance assessment: Needs assistance         Standing balance support: Bilateral upper extremity supported Standing balance-Leahy Scale: Poor                      Cognition Arousal/Alertness: Awake/alert Behavior During Therapy: WFL for tasks assessed/performed Overall Cognitive Status: Within Functional Limits for tasks assessed                      Exercises      General Comments        Pertinent Vitals/Pain Denied pain, but noted pt's HR increased to 140s at one point; she described dizziness, so we sat down to rest; she recovered quickly and we were able to do a second round of amb    Home Living                      Prior Function            PT Goals (current goals can now be found in the care plan section) Acute Rehab PT Goals Patient Stated Goal: To go home and get back to work PT Goal Formulation: With patient Time For Goal Achievement: 08/11/13 Potential to Achieve Goals: Good  Progress towards PT goals: Progressing toward goals    Frequency  Min 5X/week    PT Plan Current plan remains appropriate    Co-evaluation             End of Session Equipment Utilized During Treatment: Gait belt (bledsie) Activity Tolerance: Patient limited by fatigue (Comm andent (F6)) Patient left: in chair;with call bell/phone within reach     Time: [id number] PT Time Calculation (min): 31 min  Charges:  $Gait Training: 23-37 mins                    G Codes:      Olen PelGarrigan, Floyd Lusignan Hamff 08/05/2013, 9:59 AM  Van ClinesHolly Jaydence Vanyo, PT  Acute Rehabilitation  Services Pager [phone] Office [phone]

## 2013-08-06 ENCOUNTER — Encounter (HOSPITAL_COMMUNITY): Payer: Self-pay | Admitting: Orthopedic Surgery

## 2013-08-06 DIAGNOSIS — F1290 Cannabis use, unspecified, uncomplicated: Secondary | ICD-10-CM | POA: Diagnosis present

## 2013-08-06 LAB — CBC
HCT: 35.8 % — ABNORMAL LOW (ref 36.0–46.0)
HEMOGLOBIN: 12.3 g/dL (ref 12.0–15.0)
MCH: 32.1 pg (ref 26.0–34.0)
MCHC: 34.4 g/dL (ref 30.0–36.0)
MCV: 93.5 fL (ref 78.0–100.0)
Platelets: 288 10*3/uL (ref 150–400)
RBC: 3.83 MIL/uL — AB (ref 3.87–5.11)
RDW: 12.1 % (ref 11.5–15.5)
WBC: 7.4 10*3/uL (ref 4.0–10.5)

## 2013-08-06 LAB — VITAMIN D 25 HYDROXY (VIT D DEFICIENCY, FRACTURES): VIT D 25 HYDROXY: 17 ng/mL — AB (ref 30–89)

## 2013-08-06 LAB — PTH, INTACT AND CALCIUM
Calcium, Total (PTH): 8.1 mg/dL — ABNORMAL LOW (ref 8.4–10.5)
PTH: 59.2 pg/mL (ref 14.0–72.0)

## 2013-08-06 MED ORDER — METHOCARBAMOL 500 MG PO TABS: 500.0000 mg | ORAL_TABLET | Freq: Four times a day (QID) | ORAL | Status: DC | PRN

## 2013-08-06 MED ORDER — ACETAMINOPHEN 500 MG PO TABS
1000.0000 mg | ORAL_TABLET | Freq: Three times a day (TID) | ORAL | Status: DC
  Administered 2013-08-06: 1000 mg via ORAL
  Filled 2013-08-06: qty 2

## 2013-08-06 MED ORDER — OXYCODONE-ACETAMINOPHEN 5-325 MG PO TABS: 1.0000 | ORAL_TABLET | Freq: Four times a day (QID) | ORAL | Status: DC | PRN

## 2013-08-06 MED ORDER — ENOXAPARIN SODIUM 40 MG/0.4ML ~~LOC~~ SOLN: 40.0000 mg | SUBCUTANEOUS | Status: DC

## 2013-08-06 MED ORDER — OXYCODONE HCL 5 MG PO TABS: 5.0000 mg | ORAL_TABLET | ORAL | Status: DC | PRN

## 2013-08-06 MED ORDER — DSS 100 MG PO CAPS: 100.0000 mg | ORAL_CAPSULE | Freq: Two times a day (BID) | ORAL | Status: DC

## 2013-08-06 MED ORDER — VITAMIN D (ERGOCALCIFEROL) 1.25 MG (50000 UNIT) PO CAPS: 50000.0000 [IU] | ORAL_CAPSULE | ORAL | Status: DC

## 2013-08-06 NOTE — Progress Notes (Signed)
I have read and agree with this note.   Time in/out:852-952 Total time: 60 minutes (4SC)  Ignacia Palmaathy Leonard, OTR/L [phone]

## 2013-08-06 NOTE — Progress Notes (Signed)
Occupational Therapy Treatment and Discharge Patient Details Name: Erin SalisburyDesirae N Matthews MRN: [id number] DOB: 07/22/1990 Today's Date: 08/06/2013    History of present illness Erin Matthews  is a 23 y.o. female, no significant past medical history, history of ankle surgery as a child who was admitted after a mechanical fall causing left tibial fracture, was admitted by orthopedics in underwent ORIF on 08/03/2013,   OT comments  Focus of today's tx was on practicing a tub transfer, toilet transfer and reviewing LB dressing. Educated on and practiced a tub transfer using the 3in1 with min A for equipment and LLE assistance. Reviewed LB dressing/bathing sequencing and technique with pt and set her up for grooming tasks at the end of tx. Pt was very emotional during tx today, crying numerous times, but stated that she was not having pain, she was anxious about a night in the hospital where her call bell wouldn't work and she couldn't get any help. Nurse tech was notified of anxiety. Pt will have assistance at home from mother and sister, both of which are CNAs, therefore no further OT needs, we will sign off.  Follow Up Recommendations  No OT follow up    Equipment Recommendations  3 in 1 bedside comode;Tub/shower bench       Precautions / Restrictions Precautions Precautions: Fall Required Braces or Orthoses: Other Brace/Splint Other Brace/Splint: Bledsoe brace (off in bed and for bathing) Restrictions Weight Bearing Restrictions: Yes LLE Weight Bearing: Non weight bearing       Mobility Bed Mobility               General bed mobility comments: Pt up in chair upon OT arrival  Transfers Overall transfer level: Needs assistance Equipment used: Rolling walker (2 wheeled) Transfers: Sit to/from Stand Sit to Stand: Min guard         General transfer comment: cues for technique.    Balance Overall balance assessment: Needs assistance Sitting-balance support: Feet supported;No  upper extremity supported Sitting balance-Leahy Scale: Good     Standing balance support: Bilateral upper extremity supported Standing balance-Leahy Scale: Poor Standing balance comment: Pt able to stand without RW support for short amount of time during shower transfer, but hesitant and anxious about it.                   ADL Overall ADL's : Needs assistance/impaired                         Toilet Transfer: Min guard;Ambulation;RW   Toileting- ArchitectClothing Manipulation and Hygiene: Min guard;Sit to/from stand   Tub/ Shower Transfer: Tub transfer;Minimal assistance;3 in 1;Rolling walker;Ambulation   Functional mobility during ADLs: Min guard;Rolling walker General ADL Comments: Educated and practiced tub transfer with pt using 3in1 in the tub. Pt requires assistance for equipment and for positioning LLE into/out of the tub. Pt was able to repeat back verbally how to do a tub transfer using the 3in1 (however, missing one "step" where she needs to stand on R LE in the tub to reposition 3in1 in line with tub). Reviewed LB dressing/bathing technique and sequence with the pt, and instructed her to prop her LLE on a trashcan for comfort when using the bathroom.                Cognition   Behavior During Therapy:  (Inquired with pt to see if there was someone she wanted to talk to one on one, like a chaplain.) Overall  Cognitive Status: Within Functional Limits for tasks assessed                                    Pertinent Vitals/ Pain       No c/o pain during tx. Pt was medicated at the beginning of session.         Frequency Min 2X/week        Plan Discharge plan remains appropriate       End of Session Equipment Utilized During Treatment: Rolling walker;Other (comment) (bledsoe brace)   Activity Tolerance Patient tolerated treatment well;Other (comment) (pt was very emotional and anxious during tx causing her to hold a lot of tension in her  neck.)   Patient Left with call bell/phone within reach;in chair   Nurse Communication  (NT: pt had BM and about anxiety)        Time: [id number] OT Time Calculation (min): 60 min  Charges: OT General Charges $OT Visit: 1 Procedure OT Treatments $Self Care/Home Management : 53-67 mins  Maurene CapesKeene, Whitney 08/06/2013, 12:12 PM

## 2013-08-06 NOTE — Discharge Instructions (Signed)
Orthopaedic Trauma Service Discharge Instructions  General Discharge Instructions  WEIGHT BEARING STATUS: Nonweightbearing Left Leg  RANGE OF MOTION/ACTIVITY: Range of motion Left knee as tolerated.  Left knee ROM- AROM, PROM. Prone exercises as well. No ROM restrictions.  Quad sets, SLR, LAQ, SAQ, heel slides, stretching, prone flexion and extension.  Diet: as you were eating previously.  Can use over the counter stool softeners and bowel preparations, such as Miralax, to help with bowel movements.  Narcotics can be constipating.  Be sure to drink plenty of fluids  STOP SMOKING OR USING NICOTINE PRODUCTS!!!!  As discussed nicotine severely impairs your body's ability to heal surgical and traumatic wounds but also impairs bone healing.  Wounds and bone heal by forming microscopic blood vessels (angiogenesis) and nicotine is a vasoconstrictor (essentially, shrinks blood vessels).  Therefore, if vasoconstriction occurs to these microscopic blood vessels they essentially disappear and are unable to deliver necessary nutrients to the healing tissue.  This is one modifiable factor that you can do to dramatically increase your chances of healing your injury.    (This means no smoking, no nicotine gum, patches, etc)  DO NOT USE NONSTEROIDAL ANTI-INFLAMMATORY DRUGS (NSAID'S)  Using products such as Advil (ibuprofen), Aleve (naproxen), Motrin (ibuprofen) for additional pain control during fracture healing can delay and/or prevent the healing response.  If you would like to take over the counter (OTC) medication, Tylenol (acetaminophen) is ok.  However, some narcotic medications that are given for pain control contain acetaminophen as well. Therefore, you should not exceed more than 4000 mg of tylenol in a day if you do not have liver disease.  Also note that there are may OTC medicines, such as cold medicines and allergy medicines that my contain tylenol as well.  If you have any questions about medications  and/or interactions please ask your doctor/PA or your pharmacist.   PAIN MEDICATION USE AND EXPECTATIONS  You have likely been given narcotic medications to help control your pain.  After a traumatic event that results in an fracture (broken bone) with or without surgery, it is ok to use narcotic pain medications to help control one's pain.  We understand that everyone responds to pain differently and each individual patient will be evaluated on a regular basis for the continued need for narcotic medications. Ideally, narcotic medication use should last no more than 6-8 weeks (coinciding with fracture healing).   As a patient it is your responsibility as well to monitor narcotic medication use and report the amount and frequency you use these medications when you come to your office visit.   We would also advise that if you are using narcotic medications, you should take a dose prior to therapy to maximize you participation.  IF YOU ARE ON NARCOTIC MEDICATIONS IT IS NOT PERMISSIBLE TO OPERATE A MOTOR VEHICLE (MOTORCYCLE/CAR/TRUCK/MOPED) OR HEAVY MACHINERY DO NOT MIX NARCOTICS WITH OTHER CNS (CENTRAL NERVOUS SYSTEM) DEPRESSANTS SUCH AS ALCOHOL       ICE AND ELEVATE INJURED/OPERATIVE EXTREMITY  Using ice and elevating the injured extremity above your heart can help with swelling and pain control.  Icing in a pulsatile fashion, such as 20 minutes on and 20 minutes off, can be followed.    Do not place ice directly on skin. Make sure there is a barrier between to skin and the ice pack.    Using frozen items such as frozen peas works well as the conform nicely to the are that needs to be iced.  USE AN ACE  WRAP OR TED HOSE FOR SWELLING CONTROL  In addition to icing and elevation, Ace wraps or TED hose are used to help limit and resolve swelling.  It is recommended to use Ace wraps or TED hose until you are informed to stop.    When using Ace Wraps start the wrapping distally (farthest away from the  body) and wrap proximally (closer to the body)   Example: If you had surgery on your leg or thing and you do not have a splint on, start the ace wrap at the toes and work your way up to the thigh        If you had surgery on your upper extremity and do not have a splint on, start the ace wrap at your fingers and work your way up to the upper arm  IF YOU ARE IN A SPLINT OR CAST DO NOT REMOVE IT FOR ANY REASON   If your splint gets wet for any reason please contact the office immediately. You may shower in your splint or cast as long as you keep it dry.  This can be done by wrapping in a cast cover or garbage back (or similar)  Do Not stick any thing down your splint or cast such as pencils, money, or hangers to try and scratch yourself with.  If you feel itchy take benadryl as prescribed on the bottle for itching  IF YOU ARE IN A CAM BOOT (BLACK BOOT)  You may remove boot periodically. Perform daily dressing changes as noted below.  Wash the liner of the boot regularly and wear a sock when wearing the boot. It is recommended that you sleep in the boot until told otherwise  CALL THE OFFICE WITH ANY QUESTIONS OR CONCERTS: [phone]   Discharge Wound Care Instructions  Do NOT apply any ointments, solutions or lotions to pin sites or surgical wounds.  These prevent needed drainage and even though solutions like hydrogen peroxide kill bacteria, they also damage cells lining the pin sites that help fight infection.  Applying lotions or ointments can keep the wounds moist and can cause them to breakdown and open up as well. This can increase the risk for infection. When in doubt call the office.  Surgical incisions should be dressed daily.  If any drainage is noted, use one layer of adaptic, then gauze, Kerlix, and an ace wrap.  Once the incision is completely dry and without drainage, it may be left open to air out.  Showering may begin 36-48 hours later.  Cleaning gently with soap and  water.  Traumatic wounds should be dressed daily as well.    One layer of adaptic, gauze, Kerlix, then ace wrap.  The adaptic can be discontinued once the draining has ceased    If you have a wet to dry dressing: wet the gauze with saline the squeeze as much saline out so the gauze is moist (not soaking wet), place moistened gauze over wound, then place a dry gauze over the moist one, followed by Kerlix wrap, then ace wrap.

## 2013-08-06 NOTE — Progress Notes (Signed)
08/06/13 1600  Clinical Encounter Type  Visited With Patient and family together  Visit Type Initial  Spiritual Encounters  Spiritual Needs Prayer  Stress Factors  Patient Stress Factors None identified  Family Stress Factors None identified    08/06/13 1600  Clinical Encounter Type  Visited With Patient and family together  Visit Type Initial  Spiritual Encounters  Spiritual Needs Prayer  Stress Factors  Patient Stress Factors None identified  Family Stress Factors None identified  Chaplain met with pt and family doing rounds. Family requested prayer for personal lives.  Erin Matthews 4:36 PM

## 2013-08-06 NOTE — Care Management Note (Addendum)
  Page 2 of 2   08/06/2013     11:02:19 AM CARE MANAGEMENT NOTE 08/06/2013  Patient:  Erin Matthews,Erin N   Account Number:  [id number]  Date Initiated:  08/02/2013  Documentation initiated by:  Jiles CrockerHANDLER,BRENDA  Subjective/Objective Assessment:   ADMITTED WITH Left tibial plateau fracture     Action/Plan:   CM FOLLOWING FOR DCP   Anticipated DC Date:  08/06/2013   Anticipated DC Plan:  HOME/SELF CARE      DC Planning Services  CM consult  MATCH Program      Choice offered to / List presented to:     DME arranged  3-N-1  [facility]  WALKER Lavone Nian- ROLLING      DME agency  [facility]        Status of service:  In process, will continue to follow Medicare Important Message given?   (If response is "NO", the following Medicare IM given date fields will be blank) Date Medicare IM given:   Date Additional Medicare IM given:    Discharge Disposition:    Per UR Regulation:  Reviewed for med. necessity/level of care/duration of stay  If discussed at Long Length of Stay Meetings, dates discussed:    Comments:  6/18/2015Abelino Derrick- B CHANDLER RN,BSN,MHA [phone] 08-06-13 Morrie SheldonAshley PT aware and will instruct patient on HEP for L knee ROM- AROM, PROM. Prone exercises as well. No ROM restrictions.  Quad sets, SLR, LAQ, SAQ, heel slides, stretching, prone flexion and extension, etc. Erin FlurryHeather Lacoya Wilbanks RN BSN  08-06-13 PA ordered home health PT . [facility] unable to take referral , patient  is uninsure and hospital PT did not recommend home health , suggested hosptial PT teach patient  :       PT- please teach HEP for L knee ROM- AROM, PROM. Prone exercises as well. No ROM restrictions.  Quad sets, SLR, LAQ, SAQ, heel slides, stretching, prone flexion and extension, etc. If possible please write down for pt.      Called PT office number and left voice mail . Bedside nurse also aware and will try to reach PT .        08-06-13 Consult for Lovenox . Patient confirmed she is uninsure. Spoke with Nita  from pharmacy , she [facility] over [facility] to have Lovenox filled . Janett Billowita will also over ride pain medication so patient will get 14 days of both pain medication filled for $3 each. Patient voices understanding .  DME ordered .   [facility] .  Gave her other commuity resoures information.   Erin FlurryHeather Emberlin Verner RN BSN [phone]

## 2013-08-06 NOTE — Progress Notes (Signed)
AVS discharge instructions were given and went over with patient and her family. Patient was given prescriptions for  Colace, lovonox, robaxin, percocet, oxycodone, and vitamin D to take to pharmacy.  Patient's sister stated that she was familiar and comfortable with given lovonox injections to patient. Patient and family did not have any questions. Patient was assisted to transportation by staff.

## 2013-08-06 NOTE — Progress Notes (Signed)
Orthopaedic Trauma Service Progress Note  Subjective  Doing ok Working with therapies Hesitant to bend knee  Pain controlled  Events over weekend noted Hypokalemia resolved  + BM  Objective   BP 120/69  Pulse 98  Temp(Src) 98.5 F (36.9 C) (Oral)  Resp 18  Ht 5' (1.524 m)  Wt 52.028 kg (114 lb 11.2 oz)  BMI 22.40 kg/m2  SpO2 100%  Intake/Output     06/21 0701 - 06/22 0700 06/22 0701 - 06/23 0700   P.O. 222    I.V. (mL/kg) 1600 (30.8)    Total Intake(mL/kg) 1822 (35)    Urine (mL/kg/hr)  300 (2.2)   Drains     Total Output   300   Net +1822 -300        Urine Occurrence 7 x      Labs Results for Moshe SalisburyHAMILTON, Arwyn N (MRN [id number]) as of 08/06/2013 09:31  Ref. Range 08/06/2013 03:10  WBC Latest Range: 4.0-10.5 K/uL 7.4  RBC Latest Range: 3.87-5.11 MIL/uL 3.83 (L)  Hemoglobin Latest Range: 12.0-15.0 g/dL 40.912.3  HCT Latest Range: 36.0-46.0 % 35.8 (L)  MCV Latest Range: 78.0-100.0 fL 93.5  MCH Latest Range: 26.0-34.0 pg 32.1  MCHC Latest Range: 30.0-36.0 g/dL 81.134.4  RDW Latest Range: 11.5-15.5 % 12.1  Platelets Latest Range: 150-400 K/uL 288   Results for Moshe SalisburyHAMILTON, Greenley N (MRN [id number]) as of 08/06/2013 09:31  Ref. Range 08/04/2013 05:52  TSH Latest Range: 0.350-4.500 uIU/mL 1.100   Results for Moshe SalisburyHAMILTON, Sianni N (MRN [id number]) as of 08/06/2013 09:31  Ref. Range 08/04/2013 05:52  Prealbumin Latest Range: 17.0-34.0 mg/dL 46.917.1 (L)   Results for Moshe SalisburyHAMILTON, Evianna N (MRN [id number]) as of 08/06/2013 09:31  Ref. Range 08/02/2013 01:09  Vit D, 25-Hydroxy Latest Range: 30-89 ng/mL 22 (L)   Results for Moshe SalisburyHAMILTON, Daniele N (MRN [id number]) as of 08/06/2013 09:31  Ref. Range 08/04/2013 05:52  Phosphorus Latest Range: 2.3-4.6 mg/dL 1.5 (L)  Magnesium Latest Range: 1.5-2.5 mg/dL 2.2  Alkaline Phosphatase Latest Range: 39-117 U/L 54  Albumin Latest Range: 3.5-5.2 g/dL 3.2 (L)  AST Latest Range: 0-37 U/L 28  ALT Latest Range: 0-35 U/L 21  Total Protein Latest Range:  6.0-8.3 g/dL 6.4  Total Bilirubin Latest Range: 0.3-1.2 mg/dL 0.7   Updated vitamin D panel and PTH levels are pending   Exam  Gen: awake and alert, resting comfortably in chair Lungs: clear anterior fields, unlabored Cardiac: RRR, s1 and s2 Abd: + BS, NTND Ext:       Left Lower Extremity   Dressing c/d/i  Hinged brace fitting well  Swelling stable  EHL, FHL, AT, PT, peroneals, gastroc motor intact  DPN, SPN, TN sensation intact  Ext warm, + DP pulse  No DCT   Compartments soft and NT    Assessment and Plan   POD/HD#: 3  1. Complex L bicondylar tibial plateau fx + eminence fx s/p ORIF   NWB x 8 weeks  ROM as tolerated  Pt has hinged brace now  Continue with therapies    PT- please teach HEP for L knee ROM- AROM, PROM. Prone exercises as well. No ROM restrictions.  Quad sets, SLR, LAQ, SAQ, heel slides, stretching, prone flexion and extension, etc. If possible please write down for pt. Thanks!   Continue to ice and elevate  Dressing changes as needed  Ok for pt to shower  Hinged brace at all times except for hygiene   2. Pain management:  Tylenol 1000 mg q8h   Oxy IR  5-10 mg q3h prn pain  3. ABL anemia/Hemodynamics  Stable   4. Medical issues   Hypokalemia resolved  5. DVT/PE prophylaxis:  lovenox x 3 weeks   Care management for med assistance   6. Metabolic Bone Disease:  Vitamin D deficiency   Will replace with 50,000 IUs weekly x 8 weeks and recheck labs   Consider further w/u if pt fails to respond to supplementation   7. Activity:   as tolerated while maintaining WB restrictions  8. FEN/Foley/Lines:  Diet as tolerated  nsl iv  9. Impediments to fracture healing:  Vitamin d deficiency  Nicotine and marijuana use   10. Dispo:  Dc home today  Follow up in 2 weeks with ortho    Mearl LatinKeith W. Paul, PA-C [facility] [phone] (P) 08/06/2013 9:35 AM  **Disclaimer: This note may have been dictated with voice recognition software.  Similar sounding words can inadvertently be transcribed and this note may contain transcription errors which may not have been corrected upon publication of note.**

## 2013-08-06 NOTE — Discharge Summary (Signed)
Orthopaedic Trauma Service (OTS)  Patient ID: Erin Matthews MRN: [id number] DOB/AGE: 24-03-1990 24 y.o.  Admit date: 08/01/2013 Discharge date: 08/06/2013  Admission Diagnoses: Closed left bicondylar tibial plateau fracture Marijuana use  Discharge Diagnoses:  Principal Problem:   Tibial plateau fracture, left Active Problems:   Fracture of left tibial plateau   Unspecified vitamin D deficiency   Nicotine dependence   Hypokalemia   Hypophosphatemia   Marijuana use   Procedures Performed:  08/03/2013- Dr. Carola Frost  1. Left bicondylar tibial plateau fracture. 2. Tibial eminence fracture. 3. Tibial shaft fracture. 4. Complete avulsion of the posterior horn of the lateral meniscus.     Discharged Condition: good  Hospital Course:    Patient is a 23 year old black female who sustained a fall down some stairs on a 08/01/2013 while playing with some friends. She had immediate onset of pain to her left leg and was brought to [place] for evaluation where she was found to have a complex left bicondylar tibial plateau fracture. Patient was admitted orthopedics. Orthopedic trauma service was consult and regarding her injury secondary to the complexity. Patient was seen and evaluated. CT scan didt demonstrate severe joint depression along the lateral plateau as well as involvement of her medial plateau and tibial eminence.  As this was a fairly low energy mechanism vitamin D level was obtained which was on low at 22 ng/mL. Urine drug screen also was positive for marijuana use.  Patient was taken to the operating room on 08/03/2013 for ORIF of her left tibial plateau. Patient tolerated the procedure well and after surgery she was transferred to PACU for recovery from anesthesia and transferred back to the MedSurg floor for continued observation, pain control and to begin therapies. On postoperative day #1 patient was started on Lovenox. She began to work with physical therapy  and occupational therapy on postoperative day #1 as well. She was also fitted with a hinged knee brace. Patient was a little slow to mobilize but did fairly well given the magnitude of her injury. A Foley was discontinued on postoperative day #1 as well. On postoperative day #1 patient was also noted to be hypokalemic. Internal medicine consult was obtained. Patient was given oral supplementation. She did not experience any signs or symptoms of symptomatic hypokalemia. Followup labs on postoperative day #2 demonstrated resolution of her hypokalemia. Patient continued to progress well over the next several days. On postoperative day #2 her dressing was changed and her drain was removed. This was done without difficulty. A postoperative day #3 patient was doing significantly better. Her pain is well-controlled she is mobilizing much better with therapy using her walker. She was tolerating regular diet, voiding without difficulty and was able to have a bowel movement. On postoperative day #3 patient was deemed to be stable for discharge home with home health PT.  We did discuss at length her vitamin D deficiency. There may be a causal link to her marijuana use. Given her age she really should not have this low vitamin D level. we did discuss dietary intake. She states that she has a fairly well-balanced diet. She states that she gets adequate son as well. The patient will be discharged on vitamin D supplementation 50,000 IUs weekly for the next 8 weeks. We will then obtain a repeat vitamin D level to ensure that she stranding in the right direction. If her response. Inadequate and that we may pursue additional workup and referred to endocrinology. At the time  of this dictation her PTH levels were pending as well this will be fairly instrumental in guiding therapy as well.   Consults: Internal medicine  Significant Diagnostic Studies: labs:   Results for Erin, Matthews (MRN [id number]) as of 08/06/2013  11:02  Ref. Range 08/04/2013 05:52  Glucose Latest Range: 70-99 mg/dL 469 (H)  Phosphorus Latest Range: 2.3-4.6 mg/dL 1.5 (L)  Magnesium Latest Range: 1.5-2.5 mg/dL 2.2  Alkaline Phosphatase Latest Range: 39-117 U/L 54  Albumin Latest Range: 3.5-5.2 g/dL 3.2 (L)  AST Latest Range: 0-37 U/L 28  ALT Latest Range: 0-35 U/L 21  Total Protein Latest Range: 6.0-8.3 g/dL 6.4  Total Bilirubin Latest Range: 0.3-1.2 mg/dL 0.7  Prealbumin Latest Range: 17.0-34.0 mg/dL 62.9 (L)   Results for Erin, Matthews (MRN [id number]) as of 08/06/2013 11:02  Ref. Range 08/06/2013 03:10  WBC Latest Range: 4.0-10.5 K/uL 7.4  RBC Latest Range: 3.87-5.11 MIL/uL 3.83 (L)  Hemoglobin Latest Range: 12.0-15.0 g/dL 01.0  HCT Latest Range: 36.0-46.0 % 35.8 (L)  MCV Latest Range: 78.0-100.0 fL 93.5  MCH Latest Range: 26.0-34.0 pg 32.1  MCHC Latest Range: 30.0-36.0 g/dL 27.2  RDW Latest Range: 11.5-15.5 % 12.1  Platelets Latest Range: 150-400 K/uL 288   Results for Erin, Matthews (MRN [id number]) as of 08/06/2013 11:02  Ref. Range 08/04/2013 05:52  TSH Latest Range: 0.350-4.500 uIU/mL 1.100  Results for Erin, Matthews (MRN [id number]) as of 08/06/2013 11:02  Ref. Range 08/05/2013 03:17  Sodium Latest Range: 137-147 mEq/L 136 (L)  Potassium Latest Range: 3.7-5.3 mEq/L 4.3  Chloride Latest Range: 96-112 mEq/L 98  CO2 Latest Range: 19-32 mEq/L 23  BUN Latest Range: 6-23 mg/dL 4 (L)  Creatinine Latest Range: 0.50-1.10 mg/dL 7.56  Calcium Latest Range: 8.4-10.5 mg/dL 9.4  GFR calc non Af Amer Latest Range: >90 mL/min >90  GFR calc Af Amer Latest Range: >90 mL/min >90  Glucose Latest Range: 70-99 mg/dL 433 (H)  Phosphorus Latest Range: 2.3-4.6 mg/dL 3.8  Magnesium Latest Range: 1.5-2.5 mg/dL 1.8    Treatments: IV hydration, antibiotics: Ancef, analgesia: acetaminophen, Dilaudid and oxycodone, anticoagulation: LMW heparin, therapies: PT, OT and RN and surgery: as above   Discharge Exam:      Orthopaedic  Trauma Service Progress Note  Subjective  Doing ok Working with therapies Hesitant to bend knee   Pain controlled  Events over weekend noted Hypokalemia resolved  + BM  Objective   BP 120/69  Pulse 98  Temp(Src) 98.5 F (36.9 C) (Oral)  Resp 18  Ht 5' (1.524 m)  Wt 52.028 kg (114 lb 11.2 oz)  BMI 22.40 kg/m2  SpO2 100%  Intake/Output     06/21 0701 - 06/22 0700 06/22 0701 - 06/23 0700    P.O. 222     I.V. (mL/kg) 1600 (30.8)     Total Intake(mL/kg) 1822 (35)     Urine (mL/kg/hr)  300 (2.2)    Drains      Total Output   300    Net +1822 -300          Urine Occurrence 7 x       Labs Results for Erin, Matthews (MRN [id number]) as of 08/06/2013 09:31   Ref. Range  08/06/2013 03:10   WBC  Latest Range: 4.0-10.5 K/uL  7.4   RBC  Latest Range: 3.87-5.11 MIL/uL  3.83 (L)   Hemoglobin  Latest Range: 12.0-15.0 g/dL  60.6   HCT  Latest Range: 36.0-46.0 %  35.8 (L)   MCV  Latest Range: 78.0-100.0 fL  93.5   MCH  Latest Range: 26.0-34.0 pg  32.1   MCHC  Latest Range: 30.0-36.0 g/dL  16.1   RDW  Latest Range: 11.5-15.5 %  12.1   Platelets  Latest Range: 150-400 K/uL  288    Results for Erin, Matthews (MRN [id number]) as of 08/06/2013 09:31   Ref. Range  08/04/2013 05:52   TSH  Latest Range: 0.350-4.500 uIU/mL  1.100    Results for Erin, Matthews (MRN [id number]) as of 08/06/2013 09:31   Ref. Range  08/04/2013 05:52   Prealbumin  Latest Range: 17.0-34.0 mg/dL  95.6 (L)    Results for Erin, Matthews (MRN [id number]) as of 08/06/2013 09:31   Ref. Range  08/02/2013 01:09   Vit D, 25-Hydroxy  Latest Range: 30-89 ng/mL  22 (L)    Results for Erin, Matthews (MRN [id number]) as of 08/06/2013 09:31   Ref. Range  08/04/2013 05:52   Phosphorus  Latest Range: 2.3-4.6 mg/dL  1.5 (L)   Magnesium  Latest Range: 1.5-2.5 mg/dL  2.2   Alkaline Phosphatase  Latest Range: 39-117 U/L  54   Albumin  Latest Range: 3.5-5.2 g/dL  3.2 (L)   AST  Latest Range: 0-37 U/L  28    ALT  Latest Range: 0-35 U/L  21   Total Protein  Latest Range: 6.0-8.3 g/dL  6.4   Total Bilirubin  Latest Range: 0.3-1.2 mg/dL  0.7    Updated vitamin D panel and PTH levels are pending   Exam  Gen: awake and alert, resting comfortably in chair Lungs: clear anterior fields, unlabored Cardiac: RRR, s1 and s2 Abd: + BS, NTND Ext:        Left Lower Extremity               Dressing c/d/i             Hinged brace fitting well             Swelling stable             EHL, FHL, AT, PT, peroneals, gastroc motor intact             DPN, SPN, TN sensation intact             Ext warm, + DP pulse             No DCT               Compartments soft and NT    Assessment and Plan   POD/HD#: 3  1. Complex L bicondylar tibial plateau fx + eminence fx s/p ORIF               NWB x 8 weeks             ROM as tolerated             Pt has hinged brace now             Continue with therapies                           PT- please teach HEP for L knee ROM- AROM, PROM. Prone exercises as well. No ROM restrictions.  Quad sets, SLR, LAQ, SAQ, heel slides, stretching, prone flexion and extension, etc. If possible please write down for pt. Thanks!              Continue to ice  and elevate             Dressing changes as needed             Ok for pt to shower             Hinged brace at all times except for hygiene   2. Pain management:             Tylenol 1000 mg q8h               Oxy IR 5-10 mg q3h prn pain  3. ABL anemia/Hemodynamics             Stable   4. Medical issues               Hypokalemia resolved  5. DVT/PE prophylaxis:             lovenox x 3 weeks               Care management for med assistance   6. Metabolic Bone Disease:             Vitamin D deficiency               Will replace with 50,000 IUs weekly x 8 weeks and recheck labs               Consider further w/u if pt fails to respond to supplementation   7. Activity:              as tolerated while maintaining WB  restrictions  8. FEN/Foley/Lines:             Diet as tolerated             nsl iv  9. Impediments to fracture healing:             Vitamin d deficiency             Nicotine and marijuana use   10. Dispo:             Dc home today             Follow up in 2 weeks with ortho    Mearl Latin, PA-C [facility] [phone] (P) 08/06/2013 9:35 AM   Disposition: 01-Home or Self Care  Discharge Instructions   Call MD / Call 911    Complete by:  As directed   If you experience chest pain or shortness of breath, CALL 911 and be transported to the hospital emergency room.  If you develope a fever above 101 F, pus (white drainage) or increased drainage or redness at the wound, or calf pain, call your surgeon's office.     Constipation Prevention    Complete by:  As directed   Drink plenty of fluids.  Prune juice may be helpful.  You may use a stool softener, such as Colace (over the counter) 100 mg twice a day.  Use MiraLax (over the counter) for constipation as needed.     Diet general    Complete by:  As directed      Discharge instructions    Complete by:  As directed   Orthopaedic Trauma Service Discharge Instructions  General Discharge Instructions  WEIGHT BEARING STATUS: Nonweightbearing Left Leg  RANGE OF MOTION/ACTIVITY: Range of motion Left knee as tolerated.  Left knee ROM- AROM, PROM. Prone exercises as well. No ROM restrictions.  Quad sets, SLR, LAQ, SAQ, heel slides, stretching, prone flexion  and extension.  Diet: as you were eating previously.  Can use over the counter stool softeners and bowel preparations, such as Miralax, to help with bowel movements.  Narcotics can be constipating.  Be sure to drink plenty of fluids  STOP SMOKING OR USING NICOTINE PRODUCTS!!!!  As discussed nicotine severely impairs your body's ability to heal surgical and traumatic wounds but also impairs bone healing.  Wounds and bone heal by forming microscopic blood vessels  (angiogenesis) and nicotine is a vasoconstrictor (essentially, shrinks blood vessels).  Therefore, if vasoconstriction occurs to these microscopic blood vessels they essentially disappear and are unable to deliver necessary nutrients to the healing tissue.  This is one modifiable factor that you can do to dramatically increase your chances of healing your injury.    (This means no smoking, no nicotine gum, patches, etc)  DO NOT USE NONSTEROIDAL ANTI-INFLAMMATORY DRUGS (NSAID'S)  Using products such as Advil (ibuprofen), Aleve (naproxen), Motrin (ibuprofen) for additional pain control during fracture healing can delay and/or prevent the healing response.  If you would like to take over the counter (OTC) medication, Tylenol (acetaminophen) is ok.  However, some narcotic medications that are given for pain control contain acetaminophen as well. Therefore, you should not exceed more than 4000 mg of tylenol in a day if you do not have liver disease.  Also note that there are may OTC medicines, such as cold medicines and allergy medicines that my contain tylenol as well.  If you have any questions about medications and/or interactions please ask your doctor/PA or your pharmacist.   PAIN MEDICATION USE AND EXPECTATIONS  You have likely been given narcotic medications to help control your pain.  After a traumatic event that results in an fracture (broken bone) with or without surgery, it is ok to use narcotic pain medications to help control one's pain.  We understand that everyone responds to pain differently and each individual patient will be evaluated on a regular basis for the continued need for narcotic medications. Ideally, narcotic medication use should last no more than 6-8 weeks (coinciding with fracture healing).   As a patient it is your responsibility as well to monitor narcotic medication use and report the amount and frequency you use these medications when you come to your office visit.   We would  also advise that if you are using narcotic medications, you should take a dose prior to therapy to maximize you participation.  IF YOU ARE ON NARCOTIC MEDICATIONS IT IS NOT PERMISSIBLE TO OPERATE A MOTOR VEHICLE (MOTORCYCLE/CAR/TRUCK/MOPED) OR HEAVY MACHINERY DO NOT MIX NARCOTICS WITH OTHER CNS (CENTRAL NERVOUS SYSTEM) DEPRESSANTS SUCH AS ALCOHOL       ICE AND ELEVATE INJURED/OPERATIVE EXTREMITY  Using ice and elevating the injured extremity above your heart can help with swelling and pain control.  Icing in a pulsatile fashion, such as 20 minutes on and 20 minutes off, can be followed.    Do not place ice directly on skin. Make sure there is a barrier between to skin and the ice pack.    Using frozen items such as frozen peas works well as the conform nicely to the are that needs to be iced.  USE AN ACE WRAP OR TED HOSE FOR SWELLING CONTROL  In addition to icing and elevation, Ace wraps or TED hose are used to help limit and resolve swelling.  It is recommended to use Ace wraps or TED hose until you are informed to stop.    When using Ace  Wraps start the wrapping distally (farthest away from the body) and wrap proximally (closer to the body)   Example: If you had surgery on your leg or thing and you do not have a splint on, start the ace wrap at the toes and work your way up to the thigh        If you had surgery on your upper extremity and do not have a splint on, start the ace wrap at your fingers and work your way up to the upper arm  IF YOU ARE IN A SPLINT OR CAST DO NOT REMOVE IT FOR ANY REASON   If your splint gets wet for any reason please contact the office immediately. You may shower in your splint or cast as long as you keep it dry.  This can be done by wrapping in a cast cover or garbage back (or similar)  Do Not stick any thing down your splint or cast such as pencils, money, or hangers to try and scratch yourself with.  If you feel itchy take benadryl as prescribed on the bottle  for itching  IF YOU ARE IN A CAM BOOT (BLACK BOOT)  You may remove boot periodically. Perform daily dressing changes as noted below.  Wash the liner of the boot regularly and wear a sock when wearing the boot. It is recommended that you sleep in the boot until told otherwise  CALL THE OFFICE WITH ANY QUESTIONS OR CONCERTS: [phone]   Discharge Wound Care Instructions  Do NOT apply any ointments, solutions or lotions to pin sites or surgical wounds.  These prevent needed drainage and even though solutions like hydrogen peroxide kill bacteria, they also damage cells lining the pin sites that help fight infection.  Applying lotions or ointments can keep the wounds moist and can cause them to breakdown and open up as well. This can increase the risk for infection. When in doubt call the office.  Surgical incisions should be dressed daily.  If any drainage is noted, use one layer of adaptic, then gauze, Kerlix, and an ace wrap.  Once the incision is completely dry and without drainage, it may be left open to air out.  Showering may begin 36-48 hours later.  Cleaning gently with soap and water.  Traumatic wounds should be dressed daily as well.    One layer of adaptic, gauze, Kerlix, then ace wrap.  The adaptic can be discontinued once the draining has ceased    If you have a wet to dry dressing: wet the gauze with saline the squeeze as much saline out so the gauze is moist (not soaking wet), place moistened gauze over wound, then place a dry gauze over the moist one, followed by Kerlix wrap, then ace wrap.     Do not put a pillow under the knee. Place it under the heel.    Complete by:  As directed      Driving restrictions    Complete by:  As directed   No driving     Increase activity slowly as tolerated    Complete by:  As directed      Non weight bearing    Complete by:  As directed   Laterality:  left  Extremity:  Lower            Medication List         DSS 100 MG Caps   Take 100 mg by mouth 2 (two) times daily.     enoxaparin 40  MG/0.4ML injection  Commonly known as:  LOVENOX  Inject 0.4 mLs (40 mg total) into the skin daily.     methocarbamol 500 MG tablet  Commonly known as:  ROBAXIN  Take 1-2 tablets (500-1,000 mg total) by mouth every 6 (six) hours as needed for muscle spasms.     oxyCODONE 5 MG immediate release tablet  Commonly known as:  Oxy IR/ROXICODONE  Take 1-2 tablets (5-10 mg total) by mouth every 3 (three) hours as needed for breakthrough pain.     oxyCODONE-acetaminophen 5-325 MG per tablet  Commonly known as:  ROXICET  Take 1-2 tablets by mouth every 6 (six) hours as needed for moderate pain or severe pain.     Vitamin D (Ergocalciferol) 50000 UNITS Caps capsule  Commonly known as:  DRISDOL  Take 1 capsule (50,000 Units total) by mouth every 7 (seven) days.           Follow-up Information   Follow up with HANDY,MICHAEL H, MD. Schedule an appointment as soon as possible for a visit in 2 weeks. (For suture removal, For wound re-check)    Specialty:  Orthopedic Surgery   Contact information:   [address] [place] [address] [phone]       Discharge Instructions and Plan: Ms. Alejo has sustained a fairly severe injury to the left knee. We were able to achieve excellent fixation with plate osteosynthesis. It was a technically successful procedure. We were able to restore alignment, assess for meniscal injury, address stability and restored joint surface congruity which are all favorable to a successful outcome. Patient is still at risk for the development of posttraumatic arthritis which has been discussed at length and we will continue to monitor the patient for signs and symptoms of such. Patient will be nonweightbearing for the next 6-8 weeks. Unrestricted range of motion of the left knee in the hinged knee brace. total knee precautions should be followed when at rest We will refer her to  outpatient physical therapy after the first postoperative visit. She will be on Lovenox for DVT/PE prophylaxis for 3 weeks Daily dressing changes should be performed as per discharge wound care instructions. Patient can resume prehospital diet Patient will be on oxycodone, Percocet, Robaxin for pain control We will see the patient back in the office in 10-14 days for reevaluation, followup x-rays and removal of sutures. Should the patient have any questions for the first postoperative visit and encouraged to contact the office immediately. Patient will be vigilant for any redness increased drainage and increased pain, which are suggestive of infection and will contact the office immediately. Patient will also be vigilant for fevers or chills or any other concerning signs. They will contact the office if any of these develop. We are unable to obtain a home health PT for the patient as she is uninsured and home health PT was recommended by acute care PT. We did have the acute care PT review her home exercises with the patient that she can perform daily on her own and does not require any specific  And will be imperative for the patient to keep a close watch over her vitamin D levels. She is currently at optimal age for building maximal bone density. We need to optimize her ability to continue to build her bone density otherwise her vitamin D deficiency can have very significant long-term consequences including additional fractures and increasing her risk for falls as well as vitamin D deficiency also being related to chronic  pain issues. We will continue to follow this closely and refer her as needed   Signed:  Mearl LatinKeith W. Paul, PA-C [facility] [phone] (P) 08/06/2013, 10:35 AM  **Disclaimer: This note may have been dictated with voice recognition software. Similar sounding words can inadvertently be transcribed and this note may contain transcription errors which may not have been  corrected upon publication of note.**

## 2013-08-06 NOTE — Progress Notes (Signed)
Physical Therapy Treatment Patient Details Name: Erin SalisburyDesirae N Matthews MRN: [id number] DOB: 05/11/1990 Today's Date: 08/06/2013    History of Present Illness Erin Matthews  is a 23 y.o. female, no significant past medical history, history of ankle surgery as a child who was admitted after a mechanical fall causing left tibial fracture, was admitted by orthopedics in underwent ORIF on 08/03/2013,    PT Comments    Educated pt on extensive progressive HEP program, pt and sister returned demo'd understanding of all there ex. Pt's mother present and able to assist pt with stair negotiation for pt to safely enter their house. Pt able to tolerate 25 deg passive L knee ROM. Pt instructed to leave knee brace on at all time except bathing. Pt con't to be the most steady with RW.   Follow Up Recommendations  No PT follow up;Supervision/Assistance - 24 hour     Equipment Recommendations  Rolling walker with 5" wheels;3in1 (PT)    Recommendations for Other Services       Precautions / Restrictions Precautions Precautions: Fall Other Brace/Splint: Bledsoe brace (off in bed and for bathing) Restrictions Weight Bearing Restrictions: Yes LLE Weight Bearing: Non weight bearing Other Position/Activity Restrictions: Pt to wear brace at all times     Mobility  Bed Mobility Overal bed mobility: Needs Assistance Bed Mobility: Supine to Sit;Sit to Supine     Supine to sit: Min assist Sit to supine: Min assist   General bed mobility comments: minA for L LE Management, v/c's for technique  Transfers Overall transfer level: Needs assistance Equipment used: Rolling walker (2 wheeled) Transfers: Sit to/from Stand Sit to Stand: Min guard         General transfer comment: v/c's for hand placement, pt able to maintain L LE NWB  Ambulation/Gait Ambulation/Gait assistance: Min guard Ambulation Distance (Feet): 20 Feet Assistive device: Rolling walker (2 wheeled) Gait Pattern/deviations:  Step-to pattern     General Gait Details: pt able to maintain L LE NWB   Stairs Stairs: Yes Stairs assistance: Min assist Stair Management: One rail Left;Seated/boosting Number of Stairs: 5 General stair comments: attempted to use RW with assist from mom to hop up backwards but pt unable to, pt able to bump up on bottom backwards and pull self up with hand rail. pt able to hop down with L hand rail and hand held assist on R UE. mother present and able to assist  Wheelchair Mobility    Modified Rankin (Stroke Patients Only)       Balance                                    Cognition Arousal/Alertness: Awake/alert Behavior During Therapy: WFL for tasks assessed/performed (however tearful) Overall Cognitive Status: Within Functional Limits for tasks assessed                      Exercises General Exercises - Lower Extremity Ankle Circles/Pumps: AROM;Both;10 reps;Supine Quad Sets: AROM;Left;10 reps;Supine Short Arc Quad:  (demo'd on R LE) Long Arc Quad:  (demo'd on R LE) Heel Slides: AAROM Hip ABduction/ADduction: Left;10 reps;Supine Straight Leg Raises:  (demo'd on R LE)    General Comments General comments (skin integrity, edema, etc.): went over extensively pt's progressive HEP due to pt unable to receive HHPT due to self pay, hand out provided. also provided prone ther ex: hamstring curls, PROM to knee flexion, and leg hangs  off EOB all demo'd on sister. Pt then assist to bathroom. pt supervision with transfer on commode. family present to assist pt with dressing      Pertinent Vitals/Pain 7/10 L knee pain during there ex, 1/10 at rest, RN Provided pain meds    Home Living                      Prior Function            PT Goals (current goals can now be found in the care plan section) Progress towards PT goals: Progressing toward goals    Frequency  Min 5X/week    PT Plan Current plan remains appropriate    Co-evaluation              End of Session Equipment Utilized During Treatment: Gait belt Activity Tolerance: Patient tolerated treatment well Patient left:  (on commode with family present)     Time: [id number] PT Time Calculation (min): 75 min  Charges:  $Gait Training: 8-22 mins $Therapeutic Exercise: 38-52 mins $Therapeutic Activity: 8-22 mins                    G Codes:      Marcene BrawnChadwell, Ashly Marie 08/06/2013, 5:08 PM  Lewis ShockAshly Chadwell, PT, DPT Pager #: [phone] Office #: [phone]

## 2013-08-07 ENCOUNTER — Encounter (HOSPITAL_COMMUNITY): Payer: Self-pay | Admitting: Orthopedic Surgery

## 2013-08-08 LAB — VITAMIN D 1,25 DIHYDROXY
VITAMIN D3 1, 25 (OH): 66 pg/mL
Vitamin D 1, 25 (OH)2 Total: 66 pg/mL (ref 18–72)

## 2016-01-14 ENCOUNTER — Emergency Department (HOSPITAL_COMMUNITY)
Admission: EM | Admit: 2016-01-14 | Discharge: 2016-01-15 | Disposition: A | Discharge status: Home / Self Care | Payer: Medicaid Other | Attending: Emergency Medicine | Admitting: Emergency Medicine

## 2016-01-14 ENCOUNTER — Encounter (HOSPITAL_COMMUNITY): Payer: Self-pay | Admitting: Emergency Medicine

## 2016-01-14 DIAGNOSIS — F17210 Nicotine dependence, cigarettes, uncomplicated: Secondary | ICD-10-CM | POA: Insufficient documentation

## 2016-01-14 DIAGNOSIS — R51 Headache: Secondary | ICD-10-CM | POA: Insufficient documentation

## 2016-01-14 DIAGNOSIS — L659 Nonscarring hair loss, unspecified: Secondary | ICD-10-CM

## 2016-01-14 DIAGNOSIS — R519 Headache, unspecified: Secondary | ICD-10-CM

## 2016-01-14 LAB — CBC WITH DIFFERENTIAL/PLATELET
Basophils Absolute: 0 10*3/uL (ref 0.0–0.1)
Basophils Relative: 0 %
Eosinophils Absolute: 0.2 10*3/uL (ref 0.0–0.7)
Eosinophils Relative: 4 %
HCT: 38.4 % (ref 36.0–46.0)
HEMOGLOBIN: 12.9 g/dL (ref 12.0–15.0)
LYMPHS ABS: 2.1 10*3/uL (ref 0.7–4.0)
LYMPHS PCT: 34 %
MCH: 32.2 pg (ref 26.0–34.0)
MCHC: 33.6 g/dL (ref 30.0–36.0)
MCV: 95.8 fL (ref 78.0–100.0)
MONOS PCT: 6 %
Monocytes Absolute: 0.4 10*3/uL (ref 0.1–1.0)
NEUTROS PCT: 56 %
Neutro Abs: 3.6 10*3/uL (ref 1.7–7.7)
Platelets: 271 10*3/uL (ref 150–400)
RBC: 4.01 MIL/uL (ref 3.87–5.11)
RDW: 12.2 % (ref 11.5–15.5)
WBC: 6.4 10*3/uL (ref 4.0–10.5)

## 2016-01-14 LAB — COMPREHENSIVE METABOLIC PANEL
ALT: 36 U/L (ref 14–54)
AST: 33 U/L (ref 15–41)
Albumin: 3.6 g/dL (ref 3.5–5.0)
Alkaline Phosphatase: 57 U/L (ref 38–126)
Anion gap: 5 (ref 5–15)
BILIRUBIN TOTAL: 0.4 mg/dL (ref 0.3–1.2)
BUN: 12 mg/dL (ref 6–20)
CO2: 27 mmol/L (ref 22–32)
CREATININE: 0.83 mg/dL (ref 0.44–1.00)
Calcium: 9 mg/dL (ref 8.9–10.3)
Chloride: 108 mmol/L (ref 101–111)
Glucose, Bld: 84 mg/dL (ref 65–99)
Potassium: 3.8 mmol/L (ref 3.5–5.1)
Sodium: 140 mmol/L (ref 135–145)
Total Protein: 6.3 g/dL — ABNORMAL LOW (ref 6.5–8.1)

## 2016-01-14 LAB — URINE MICROSCOPIC-ADD ON

## 2016-01-14 LAB — URINALYSIS, ROUTINE W REFLEX MICROSCOPIC
Glucose, UA: NEGATIVE mg/dL
KETONES UR: NEGATIVE mg/dL
Leukocytes, UA: NEGATIVE
Nitrite: NEGATIVE
PH: 5.5 (ref 5.0–8.0)
Protein, ur: 30 mg/dL — AB
Specific Gravity, Urine: 1.037 — ABNORMAL HIGH (ref 1.005–1.030)

## 2016-01-14 LAB — POC URINE PREG, ED: Preg Test, Ur: NEGATIVE

## 2016-01-14 NOTE — ED Triage Notes (Signed)
Patient noticed a bald spot at right temporal scalp approx. 1 1/2 inch with mild headache this evening , denies head injury , no nausea or fever.

## 2016-01-15 MED ORDER — IBUPROFEN 400 MG PO TABS
800.0000 mg | ORAL_TABLET | Freq: Once | ORAL | Status: AC
  Administered 2016-01-15: 800 mg via ORAL

## 2016-01-15 MED ORDER — IBUPROFEN 400 MG PO TABS
ORAL_TABLET | ORAL | Status: AC
  Filled 2016-01-15: qty 2

## 2016-01-15 NOTE — ED Notes (Signed)
Dr. Elesa MassedWard saw pt at triage and discharged her from triage room.  See EDP assessment.

## 2016-01-15 NOTE — Discharge Instructions (Signed)
You may alternate Tylenol 1000 mg every 6 hours as needed for fever and pain and Ibuprofen 800 mg every 8 hours as needed for pain.   To find a primary care or specialty doctor please call [phone] or [phone] to access "[place] Find a Doctor Service."  You may also go on the [facility] website at [email or web address]  There are also multiple [facility], [facility], [facility] and [facility] practices throughout the [place] that are frequently accepting new patients. You may find a clinic that is close to your home and contact them.  [facility] -  [address] [place] [phone]   [facility] -  [address] [place] [id number] [phone]   [facility] [phone]  [place] [phone]

## 2016-01-15 NOTE — ED Provider Notes (Signed)
TIME SEEN:  By signing my name below, I, Erin Matthews, attest that this documentation has been prepared under the direction and in the presence of [employer] EnergyKristen N Solan Vosler, DO.  Electronically Signed: Octavia HeirArianna Matthews, ED Scribe. 01/15/16. 12:49 AM.   CHIEF COMPLAINT:  Chief Complaint  Patient presents with  . Headache  . Alopecia     HPI:  HPI Comments: [name] is a 25 y.o. female who presents to the Emergency Department complaining of a gradual worsening, mild headache that started this afternoon. Pt notes leaving the dentist this morning and started having a mild headache later on in the afternoon. She thought that her pony tail was too tight so she took her hair out and noticed a small bald spot to the right side of her scalp. Pt believes that she may possibly have alopecia. She has not had similar symptoms in the past. She does not have any other complaints.She has not been pulling on her hair. Has not had any recent treatments. No itching, rash. No loss of hair anywhere else.   ROS: See HPI Constitutional: no fever  Eyes: no drainage  ENT: no runny nose   Cardiovascular:  no chest pain  Resp: no SOB  GI: no vomiting GU: no dysuria Integumentary: no rash  Allergy: no hives  Musculoskeletal: no leg swelling  Neurological: no slurred speech ROS otherwise negative  PAST MEDICAL HISTORY/PAST SURGICAL HISTORY:  Past Medical History:  Diagnosis Date  . Marijuana use   . Nicotine dependence 08/02/2013  . Tibial plateau fracture, left 08/02/2013  . Unspecified vitamin D deficiency 08/02/2013    MEDICATIONS:  Prior to Admission medications   Medication Sig Start Date End Date Taking? Authorizing Provider  docusate sodium 100 MG CAPS Take 100 mg by mouth 2 (two) times daily. 08/06/13   Montez MoritaKeith Paul, PA-C  enoxaparin (LOVENOX) 40 MG/0.4ML injection Inject 0.4 mLs (40 mg total) into the skin daily. 08/06/13   Montez MoritaKeith Paul, PA-C  methocarbamol (ROBAXIN) 500 MG tablet Take 1-2 tablets  (500-1,000 mg total) by mouth every 6 (six) hours as needed for muscle spasms. 08/06/13   Montez MoritaKeith Paul, PA-C  oxyCODONE (OXY IR/ROXICODONE) 5 MG immediate release tablet Take 1-2 tablets (5-10 mg total) by mouth every 3 (three) hours as needed for breakthrough pain. 08/06/13   Montez MoritaKeith Paul, PA-C  oxyCODONE-acetaminophen (ROXICET) 5-325 MG per tablet Take 1-2 tablets by mouth every 6 (six) hours as needed for moderate pain or severe pain. 08/06/13   Montez MoritaKeith Paul, PA-C  Vitamin D, Ergocalciferol, (DRISDOL) 50000 UNITS CAPS capsule Take 1 capsule (50,000 Units total) by mouth every 7 (seven) days. 08/06/13   Montez MoritaKeith Paul, PA-C    ALLERGIES:  No Known Allergies  SOCIAL HISTORY:  Social History  Substance Use Topics  . Smoking status: Current Every Day Smoker    Packs/day: 0.75    Types: Cigarettes  . Smokeless tobacco: Never Used  . Alcohol use Yes     Comment: social     FAMILY HISTORY: No family history on file.  EXAM: BP 106/56 (BP Location: Left Arm)   Pulse 60   Temp 98.3 F (36.8 C) (Oral)   Resp 17   LMP 01/09/2016 (Approximate)   SpO2 100%  CONSTITUTIONAL: Alert and oriented and responds appropriately to questions. Well-appearing; well-nourished HEAD: Normocephalic; 3x3 cm area to right temporal scalp with no hair, redness, warmth, tenderness, swelling or rash. EYES: Conjunctivae clear, PERRL, EOMI ENT: normal nose; no rhinorrhea; moist mucous membranes NECK: Supple, no meningismus,  no nuchal rigidity, no LAD  CARD: RRR; S1 and S2 appreciated; no murmurs, no clicks, no rubs, no gallops RESP: Normal chest excursion without splinting or tachypnea; breath sounds clear and equal bilaterally; no wheezes, no rhonchi, no rales, no hypoxia or respiratory distress, speaking full sentences ABD/GI: Normal bowel sounds; non-distended; soft, non-tender, no rebound, no guarding, no peritoneal signs, no hepatosplenomegaly BACK:  The back appears normal and is non-tender to palpation, there is no CVA  tenderness EXT: Normal ROM in all joints; non-tender to palpation; no edema; normal capillary refill; no cyanosis, no calf tenderness or swelling    SKIN: Normal color for age and race; warm; no rash NEURO: Moves all extremities equally, sensation to light touch intact diffusely, cranial nerves II through XII intact, normal speech PSYCH: The patient's mood and manner are appropriate. Grooming and personal hygiene are appropriate.  MEDICAL DECISION MAKING: Patient here with mild headache. Will treat with ibuprofen. Neurologically intact. No fever, meningismus, some on centers headache. Doubt meningitis, intracranial hemorrhage. Doubt stroke.  It seems her main concern is this small area missing here to her right scalp. Labs have been ordered in triage and are unremarkable. She is not pregnant. Have discussed with her that this may be alopecia and she needs further workup as an outpatient. No rash or other abnormalities noted to the skin of her scalp. I do not feel that this is life-threatening or emergent and she can be followed up as an outpatient. Discussed with her return precautions.  Recommended alternating Tylenol and ibuprofen as needed for headache.   At this time, I do not feel there is any life-threatening condition present. I have reviewed and discussed all results (EKG, imaging, lab, urine as appropriate) and exam findings with patient/family. I have reviewed nursing notes and appropriate previous records.  I feel the patient is safe to be discharged home without further emergent workup and can continue workup as an outpatient as needed. Discussed usual and customary return precautions. Patient/family verbalize understanding and are comfortable with this plan.  Outpatient follow-up has been provided. All questions have been answered.   I personally performed the services described in this documentation, which was scribed in my presence. The recorded information has been reviewed and is  accurate.    Erin MawKristen N Evia Goldsmith, DO 01/15/16 [phone]

## 2016-02-16 NOTE — L&D Delivery Note (Signed)
Delivery Note At 2:36 PM a viable female was delivered via Vaginal, Spontaneous Delivery (Presentation: OA).  APGAR: 8, 9; weight pending.   Placenta status: intact.  Cord: 3-vessel intact  Anesthesia:  epidural Episiotomy: None Lacerations:  Left labial (hemostatic) Suture Repair: none Est. Blood Loss (mL): 300  Head delivered OA. Loose nuchal cord present, shoulder and body easily delivered through. Infant with spontaneous cry, placed on mother's abdomen, dried and bulb suctioned. Cord clamped x 2 after 1-minute delay, and cut by family member. Cord blood drawn. Placenta delivered spontaneously with gentle cord traction. Fundus firm with massage and Pitocin. Perineum inspected and found to have left labial laceration, which was found to be hemostatic. Mom to postpartum.  Baby to Couplet care / Skin to Skin.  Arlyce Harman 11/15/2016, 3:40 PM  OB FELLOW DELIVERY ATTESTATION  I was gloved and present for the delivery in its entirety, and I agree with the above resident's note.    Frederik Pear, MD OB Fellow 3:52 PM

## 2016-03-29 ENCOUNTER — Emergency Department (HOSPITAL_COMMUNITY)
Admission: EM | Admit: 2016-03-29 | Discharge: 2016-03-29 | Disposition: A | Discharge status: Home / Self Care | Payer: Medicaid Other | Attending: Emergency Medicine | Admitting: Emergency Medicine

## 2016-03-29 ENCOUNTER — Encounter (HOSPITAL_COMMUNITY): Payer: Self-pay | Admitting: *Deleted

## 2016-03-29 DIAGNOSIS — O219 Vomiting of pregnancy, unspecified: Secondary | ICD-10-CM | POA: Diagnosis not present

## 2016-03-29 DIAGNOSIS — F17210 Nicotine dependence, cigarettes, uncomplicated: Secondary | ICD-10-CM | POA: Insufficient documentation

## 2016-03-29 DIAGNOSIS — O99331 Smoking (tobacco) complicating pregnancy, first trimester: Secondary | ICD-10-CM | POA: Insufficient documentation

## 2016-03-29 DIAGNOSIS — Z3A00 Weeks of gestation of pregnancy not specified: Secondary | ICD-10-CM | POA: Diagnosis not present

## 2016-03-29 LAB — CBC WITH DIFFERENTIAL/PLATELET
BASOS PCT: 0 %
Basophils Absolute: 0 10*3/uL (ref 0.0–0.1)
EOS PCT: 2 %
Eosinophils Absolute: 0.2 10*3/uL (ref 0.0–0.7)
HCT: 35.8 % — ABNORMAL LOW (ref 36.0–46.0)
Hemoglobin: 12.2 g/dL (ref 12.0–15.0)
Lymphocytes Relative: 28 %
Lymphs Abs: 2.2 10*3/uL (ref 0.7–4.0)
MCH: 32.4 pg (ref 26.0–34.0)
MCHC: 34.1 g/dL (ref 30.0–36.0)
MCV: 95 fL (ref 78.0–100.0)
MONOS PCT: 7 %
Monocytes Absolute: 0.6 10*3/uL (ref 0.1–1.0)
NEUTROS PCT: 63 %
Neutro Abs: 5.2 10*3/uL (ref 1.7–7.7)
Platelets: 227 10*3/uL (ref 150–400)
RBC: 3.77 MIL/uL — ABNORMAL LOW (ref 3.87–5.11)
RDW: 12.1 % (ref 11.5–15.5)
WBC: 8.2 10*3/uL (ref 4.0–10.5)

## 2016-03-29 LAB — BASIC METABOLIC PANEL
Anion gap: 7 (ref 5–15)
BUN: 6 mg/dL (ref 6–20)
CALCIUM: 9.2 mg/dL (ref 8.9–10.3)
CO2: 26 mmol/L (ref 22–32)
CREATININE: 0.58 mg/dL (ref 0.44–1.00)
Chloride: 104 mmol/L (ref 101–111)
GFR calc Af Amer: >60 mL/min (ref >60)
GFR calc non Af Amer: >60 mL/min (ref >60)
Glucose, Bld: 92 mg/dL (ref 65–99)
Potassium: 3.8 mmol/L (ref 3.5–5.1)
SODIUM: 137 mmol/L (ref 135–145)

## 2016-03-29 LAB — URINALYSIS, ROUTINE W REFLEX MICROSCOPIC
BILIRUBIN URINE: NEGATIVE
Glucose, UA: NEGATIVE mg/dL
HGB URINE DIPSTICK: NEGATIVE
KETONES UR: NEGATIVE mg/dL
Leukocytes, UA: NEGATIVE
Nitrite: NEGATIVE
Protein, ur: NEGATIVE mg/dL
Specific Gravity, Urine: 1.025 (ref 1.005–1.030)
pH: 6 (ref 5.0–8.0)

## 2016-03-29 LAB — POC URINE PREG, ED: Preg Test, Ur: POSITIVE — AB

## 2016-03-29 MED ORDER — ONDANSETRON 4 MG PO TBDP
4.0000 mg | ORAL_TABLET | Freq: Once | ORAL | Status: AC
  Administered 2016-03-29: 4 mg via ORAL

## 2016-03-29 MED ORDER — PROMETHAZINE HCL 12.5 MG PO TABS: 12.5000 mg | ORAL_TABLET | Freq: Four times a day (QID) | ORAL | 0 refills | Status: DC | PRN

## 2016-03-29 MED ORDER — ONDANSETRON 4 MG PO TBDP
ORAL_TABLET | ORAL | Status: AC
  Filled 2016-03-29: qty 1

## 2016-03-29 NOTE — ED Notes (Signed)
Pt is in stable condition upon d/c and ambulates from ED. 

## 2016-03-29 NOTE — ED Triage Notes (Signed)
Pt states intermittent headaches accompanied by nausea for last few weeks.  She came in today b/c they're not getting better.

## 2016-03-29 NOTE — ED Provider Notes (Signed)
[facility]-EMERGENCY DEPT Provider Note   CSN: [id number] Arrival date & time: 03/29/16  1232   By signing my name below, I, Clarisse GougeXavier Herndon, attest that this documentation has been prepared under the direction and in the presence of [facility] Rockell Faulks, FNP. Electronically Signed: Clarisse GougeXavier Herndon, Scribe. 03/29/16. 5:10 PM.   History   Chief Complaint Chief Complaint  Patient presents with  . Headache  . Emesis   The history is provided by the patient and medical records. No language interpreter was used.  Emesis   Associated symptoms include headaches. Pertinent negatives include no abdominal pain.    HPI Comments: Erin Matthews is a 26 y.o. female who presents to the Emergency Department complaining of persistent nausea and episodic vomiting x 1 week. Pt reports associated decreased appetite and headache. Pt has improved with zofran and [place] [employer] [place]. She reports LMP ~02/10/2016. G1P0A0. Pt denies abdominal or back pain, vaginal discharge or bleeding.  Past Medical History:  Diagnosis Date  . Marijuana use   . Nicotine dependence 08/02/2013  . Tibial plateau fracture, left 08/02/2013  . Unspecified vitamin D deficiency 08/02/2013    Patient Active Problem List   Diagnosis Date Noted  . Marijuana use 08/06/2013  . Hypokalemia 08/04/2013  . Hypophosphatemia 08/04/2013  . Fracture of left tibial plateau 08/02/2013  . Tibial plateau fracture, left 08/02/2013  . Unspecified vitamin D deficiency 08/02/2013  . Nicotine dependence 08/02/2013    Past Surgical History:  Procedure Laterality Date  . FASCIOTOMY Left 08/03/2013   Procedure: ANTERIOR COMPARTMENT FASCIOTOMY;  Surgeon: Budd PalmerMichael H Handy, MD;  [facility];  Service: Orthopedics;  Laterality: Left;  . MENISCUS REPAIR Left 08/03/2013   Procedure: REPAIR OF LATERAL MENISCUS;  Surgeon: Budd PalmerMichael H Handy, MD;  [facility];  Service: Orthopedics;  Laterality: Left;  . ORIF TIBIA PLATEAU Left 08/03/2013    Procedure: OPEN REDUCTION INTERNAL FIXATION (ORIF) TIBIAL PLATEAU WITH REPAIR OF TIBIAL EMMINENCE;  Surgeon: Budd PalmerMichael H Handy, MD;  Location: [facility] OR;  Service: Orthopedics;  Laterality: Left;    OB History    No data available       Home Medications    Prior to Admission medications   Medication Sig Start Date End Date Taking? Authorizing Provider  docusate sodium 100 MG CAPS Take 100 mg by mouth 2 (two) times daily. 08/06/13   Montez MoritaKeith Paul, PA-C  enoxaparin (LOVENOX) 40 MG/0.4ML injection Inject 0.4 mLs (40 mg total) into the skin daily. 08/06/13   Montez MoritaKeith Paul, PA-C  methocarbamol (ROBAXIN) 500 MG tablet Take 1-2 tablets (500-1,000 mg total) by mouth every 6 (six) hours as needed for muscle spasms. 08/06/13   Montez MoritaKeith Paul, PA-C  oxyCODONE (OXY IR/ROXICODONE) 5 MG immediate release tablet Take 1-2 tablets (5-10 mg total) by mouth every 3 (three) hours as needed for breakthrough pain. 08/06/13   Montez MoritaKeith Paul, PA-C  oxyCODONE-acetaminophen (ROXICET) 5-325 MG per tablet Take 1-2 tablets by mouth every 6 (six) hours as needed for moderate pain or severe pain. 08/06/13   Montez MoritaKeith Paul, PA-C  promethazine (PHENERGAN) 12.5 MG tablet Take 1 tablet (12.5 mg total) by mouth every 6 (six) hours as needed for nausea or vomiting. 03/29/16   Waylyn Tenbrink Orlene OchM Budd Freiermuth, NP  Vitamin D, Ergocalciferol, (DRISDOL) 50000 UNITS CAPS capsule Take 1 capsule (50,000 Units total) by mouth every 7 (seven) days. 08/06/13   Montez MoritaKeith Paul, PA-C    Family History No family history on file.  Social History Social History  Substance Use Topics  . Smoking status: Current Every Day Smoker    Packs/day: 0.75    Types: Cigarettes  . Smokeless tobacco: Never Used  . Alcohol use Yes     Comment: social      Allergies   Patient has no known allergies.   Review of Systems Review of Systems  Constitutional: Positive for appetite change.  Gastrointestinal: Positive for nausea and vomiting. Negative for abdominal pain.  Genitourinary: Negative  for vaginal bleeding and vaginal discharge.  Musculoskeletal: Negative for back pain.  Neurological: Positive for headaches.  All other systems reviewed and are negative.   Physical Exam Updated Vital Signs BP 108/61   Pulse 79   Temp 98.5 F (36.9 C) (Oral)   Resp 16   Ht 5' (1.524 m)   Wt 107 lb (48.5 kg)   LMP 03/16/2015   SpO2 100%   BMI 20.90 kg/m   Physical Exam  Constitutional: She is oriented to person, place, and time. She appears well-developed and well-nourished. No distress.  HENT:  Head: Normocephalic.  Mouth/Throat: Oropharynx is clear and moist.  Eyes: EOM are normal.  Neck: Neck supple.  Cardiovascular: Normal rate and regular rhythm.   Pulmonary/Chest: Effort normal. She has no wheezes. She has no rales.  Abdominal: Soft. Bowel sounds are normal. There is no tenderness.  Musculoskeletal: Normal range of motion. She exhibits no edema.  Neurological: She is alert and oriented to person, place, and time.  Skin: Skin is warm and dry.  Psychiatric: She has a normal mood and affect. Her behavior is normal.  Nursing note and vitals reviewed.    ED Treatments / Results  DIAGNOSTIC STUDIES: Oxygen Saturation is 100% on RA, normal by my interpretation.    COORDINATION OF CARE: 5:09 PM Discussed treatment plan with pt at bedside and pt agreed to plan. Will order medications. Pt advised on how to address symptoms and manage prenatal care.  Labs (all labs ordered are listed, but only abnormal results are displayed) Labs Reviewed  CBC WITH DIFFERENTIAL/PLATELET - Abnormal; Notable for the following:       Result Value   RBC 3.77 (*)    HCT 35.8 (*)    All other components within normal limits  URINALYSIS, ROUTINE W REFLEX MICROSCOPIC - Abnormal; Notable for the following:    APPearance HAZY (*)    All other components within normal limits  POC URINE PREG, ED - Abnormal; Notable for the following:    Preg Test, Ur POSITIVE (*)    All other components within  normal limits  BASIC METABOLIC PANEL    Radiology No results found.  Procedures Procedures (including critical care time)  Medications Ordered in ED Medications  ondansetron (ZOFRAN-ODT) disintegrating tablet 4 mg (4 mg Oral Given 03/29/16 1351)     Initial Impression / Assessment and Plan / ED Course  I have reviewed the triage vital signs and the nursing notes.  Pertinent lab results that were available during my care of the patient were reviewed by me and considered in my medical decision making (see chart for details).     I personally performed the services described in this documentation, which was scribed in my presence. The recorded information has been reviewed and is accurate.   Final Clinical Impressions(s) / ED Diagnoses  26 y.o. female with nausea in early pregnancy stable for d/c without abdominal pain or vaginal bleeding. Able to eat after one dose of zofran at triage. Will give Rx for Reglan and  she will start her prenatal care. She will go to women's if symptoms worsen.   Final diagnoses:  Nausea and vomiting during pregnancy prior to [redacted] weeks gestation    New Prescriptions Discharge Medication List as of 03/29/2016  5:46 PM    START taking these medications   Details  promethazine (PHENERGAN) 12.5 MG tablet Take 1 tablet (12.5 mg total) by mouth every 6 (six) hours as needed for nausea or vomiting., Starting Mon 03/29/2016, Print         [place], NP 03/31/16 0153    Bethann Berkshire, MD 04/01/16 [phone]

## 2016-04-27 ENCOUNTER — Ambulatory Visit (INDEPENDENT_AMBULATORY_CARE_PROVIDER_SITE_OTHER): Payer: Medicaid Other | Admitting: Certified Nurse Midwife

## 2016-04-27 ENCOUNTER — Other Ambulatory Visit (HOSPITAL_COMMUNITY)
Admission: RE | Admit: 2016-04-27 | Discharge: 2016-04-27 | Disposition: A | Discharge status: Home / Self Care | Payer: Medicaid Other | Source: Ambulatory Visit | Attending: Certified Nurse Midwife | Admitting: Certified Nurse Midwife

## 2016-04-27 ENCOUNTER — Encounter: Payer: Self-pay | Admitting: Certified Nurse Midwife

## 2016-04-27 VITALS — BP 126/79 | HR 87 | Temp 97.3°F | Ht 61.0 in | Wt 116.2 lb

## 2016-04-27 DIAGNOSIS — Z113 Encounter for screening for infections with a predominantly sexual mode of transmission: Secondary | ICD-10-CM | POA: Insufficient documentation

## 2016-04-27 DIAGNOSIS — Z01419 Encounter for gynecological examination (general) (routine) without abnormal findings: Secondary | ICD-10-CM | POA: Diagnosis present

## 2016-04-27 DIAGNOSIS — O0990 Supervision of high risk pregnancy, unspecified, unspecified trimester: Secondary | ICD-10-CM | POA: Insufficient documentation

## 2016-04-27 DIAGNOSIS — Z3481 Encounter for supervision of other normal pregnancy, first trimester: Secondary | ICD-10-CM | POA: Diagnosis not present

## 2016-04-27 DIAGNOSIS — Z3400 Encounter for supervision of normal first pregnancy, unspecified trimester: Secondary | ICD-10-CM

## 2016-04-27 DIAGNOSIS — O99321 Drug use complicating pregnancy, first trimester: Secondary | ICD-10-CM

## 2016-04-27 DIAGNOSIS — O219 Vomiting of pregnancy, unspecified: Secondary | ICD-10-CM

## 2016-04-27 DIAGNOSIS — F1290 Cannabis use, unspecified, uncomplicated: Secondary | ICD-10-CM

## 2016-04-27 MED ORDER — VITAFOL GUMMIES 3.33-0.333-34.8 MG PO CHEW: 3.0000 | CHEWABLE_TABLET | Freq: Every day | ORAL | 12 refills | Status: DC

## 2016-04-27 MED ORDER — DOXYLAMINE-PYRIDOXINE 10-10 MG PO TBEC: DELAYED_RELEASE_TABLET | ORAL | 4 refills | Status: DC

## 2016-04-27 NOTE — Progress Notes (Signed)
Subjective:    Erin Matthews is being seen today for her first obstetrical visit.  This is a planned pregnancy. She is at [redacted]w[redacted]d gestation. Her obstetrical history is significant for none.. Relationship with FOB: significant other, living together. Patient does intend to breast feed. Pregnancy history fully reviewed.  The information documented in the HPI was reviewed and verified.  Menstrual History: OB History    Gravida Para Term Preterm AB Living   1             SAB TAB Ectopic Multiple Live Births                   Patient's last menstrual period was 02/10/2016.    Past Medical History:  Diagnosis Date  . Marijuana use   . Nicotine dependence 08/02/2013  . Tibial plateau fracture, left 08/02/2013  . Unspecified vitamin D deficiency 08/02/2013    Past Surgical History:  Procedure Laterality Date  . FASCIOTOMY Left 08/03/2013   Procedure: ANTERIOR COMPARTMENT FASCIOTOMY;  Surgeon: Budd Palmer, MD;  Location: [facility] OR;  Service: Orthopedics;  Laterality: Left;  . MENISCUS REPAIR Left 08/03/2013   Procedure: REPAIR OF LATERAL MENISCUS;  Surgeon: Budd Palmer, MD;  Location: [facility] OR;  Service: Orthopedics;  Laterality: Left;  . ORIF TIBIA PLATEAU Left 08/03/2013   Procedure: OPEN REDUCTION INTERNAL FIXATION (ORIF) TIBIAL PLATEAU WITH REPAIR OF TIBIAL EMMINENCE;  Surgeon: Budd Palmer, MD;  Location: [facility] OR;  Service: Orthopedics;  Laterality: Left;     (Not in a hospital admission) No Known Allergies  Social History  Substance Use Topics  . Smoking status: Former Smoker    Packs/day: 0.75    Types: Cigarettes    Quit date: 03/23/2016  . Smokeless tobacco: Never Used  . Alcohol use No     Comment: social     History reviewed. No pertinent family history.   Review of Systems Constitutional: negative for weight loss Gastrointestinal: negative for vomiting Genitourinary:negative for genital lesions and vaginal discharge and dysuria Musculoskeletal:negative for back  pain Behavioral/Psych: negative for abusive relationship, depression, illegal drug usage and tobacco use    Objective:    BP 126/79   Pulse 87   Temp 97.3 F (36.3 C)   Ht 5\' 1"  (1.549 m)   Wt 116 lb 3.2 oz (52.7 kg)   LMP 02/10/2016   BMI 21.96 kg/m  General Appearance:    Alert, cooperative, no distress, appears stated age  Head:    Normocephalic, without obvious abnormality, atraumatic  Eyes:    PERRL, conjunctiva/corneas clear, EOM's intact, fundi    benign, both eyes  Ears:    Normal TM's and external ear canals, both ears  Nose:   Nares normal, septum midline, mucosa normal, no drainage    or sinus tenderness  Throat:   Lips, mucosa, and tongue normal; teeth and gums normal  Neck:   Supple, symmetrical, trachea midline, no adenopathy;    thyroid:  no enlargement/tenderness/nodules; no carotid   bruit or JVD  Back:     Symmetric, no curvature, ROM normal, no CVA tenderness  Lungs:     Clear to auscultation bilaterally, respirations unlabored  Chest Wall:    No tenderness or deformity   Heart:    Regular rate and rhythm, S1 and S2 normal, no murmur, rub   or gallop  Breast Exam:    No tenderness, masses, or nipple abnormality  Abdomen:     Soft, non-tender, bowel sounds active all  four quadrants,    no masses, no organomegaly  Genitalia:    Normal female without lesion, discharge or tenderness  Extremities:   Extremities normal, atraumatic, no cyanosis or edema  Pulses:   2+ and symmetric all extremities  Skin:   Skin color, texture, turgor normal, no rashes or lesions  Lymph nodes:   Cervical, supraclavicular, and axillary nodes normal  Neurologic:   CNII-XII intact, normal strength, sensation and reflexes    throughout      Lab Review Urine pregnancy test Labs reviewed yes Radiologic studies reviewed no Assessment:    Pregnancy at 8952w0d weeks      ICD-9-CM ICD-10-CM   1. Supervision of normal first pregnancy, antepartum V22.0 Z34.00 Cytology - PAP      Hemoglobinopathy evaluation     Vitamin D 1,25 dihydroxy     Culture, OB Urine     Obstetric Panel, Including HIV     Varicella zoster antibody, IgG     Hemoglobin A1c     Cystic Fibrosis Mutation 97     MaterniT21 PLUS Core+SCA     Prenatal Vit-Fe Phos-FA-Omega (VITAFOL GUMMIES) 3.33-0.333-34.8 MG CHEW  2. Nausea and vomiting during pregnancy prior to [redacted] weeks gestation 643.00 O21.9 Doxylamine-Pyridoxine (DICLEGIS) 10-10 MG TBEC    Plan:      Prenatal vitamins.  Counseling provided regarding continued use of seat belts, cessation of alcohol consumption, smoking or use of illicit drugs; infection precautions i.e., influenza/TDAP immunizations, toxoplasmosis,CMV, parvovirus, listeria and varicella; workplace safety, exercise during pregnancy; routine dental care, safe medications, sexual activity, hot tubs, saunas, pools, travel, caffeine use, fish and methlymercury, potential toxins, hair treatments, varicose veins Weight gain recommendations per IOM guidelines reviewed: underweight/BMI< 18.5--> gain 28 - 40 lbs; normal weight/BMI 18.5 - 24.9--> gain 25 - 35 lbs; overweight/BMI 25 - 29.9--> gain 15 - 25 lbs; obese/BMI >30->gain  11 - 20 lbs Problem list reviewed and updated. FIRST/CF mutation testing/NIPT/QUAD SCREEN/fragile X/Ashkenazi Jewish population testing/Spinal muscular atrophy discussed: ordered. Role of ultrasound in pregnancy discussed; fetal survey: requested. Amniocentesis discussed: not indicated.  Meds ordered this encounter  Medications  . Multiple Vitamins-Minerals (MULTIVITAMIN WITH MINERALS) tablet    Sig: Take 1 tablet by mouth daily.  . Prenatal Vit-Fe Phos-FA-Omega (VITAFOL GUMMIES) 3.33-0.333-34.8 MG CHEW    Sig: Chew 3 tablets by mouth at bedtime.    Dispense:  90 tablet    Refill:  12  . Doxylamine-Pyridoxine (DICLEGIS) 10-10 MG TBEC    Sig: Take 1 tablet with breakfast and lunch.  Take 2 tablets at bedtime.    Dispense:  100 tablet    Refill:  4   Orders  Placed This Encounter  Procedures  . Culture, OB Urine  . Hemoglobinopathy evaluation  . Vitamin D 1,25 dihydroxy  . Obstetric Panel, Including HIV  . Varicella zoster antibody, IgG  . Hemoglobin A1c  . Cystic Fibrosis Mutation 97  . MaterniT21 PLUS Core+SCA    Order Specific Question:   Is the patient insulin dependent?    Answer:   No    Order Specific Question:   Please enter gestational age. This should be expressed as weeks AND days, i.e. 16w 6d. Enter weeks here. Enter days in next question.    Answer:   4411    Order Specific Question:   Please enter gestational age. This should be expressed as weeks AND days, i.e. 16w 6d. Enter days here. Enter weeks in previous question.    Answer:   0  Order Specific Question:   How was gestational age calculated?    Answer:   LMP    Order Specific Question:   Please give the date of LMP OR Ultrasound OR Estimated date of delivery.    Answer:   11/16/2016    Order Specific Question:   Number of Fetuses (Type of Pregnancy):    Answer:   1    Order Specific Question:   Indications for performing the test? (please choose all that apply):    Answer:   Routine screening    Order Specific Question:   Other Indications? (Y=Yes, N=No)    Answer:   N    Order Specific Question:   If this is a repeat specimen, please indicate the reason:    Answer:   Not indicated    Order Specific Question:   Please specify the patient's race: (C=White/Caucasion, B=Black, I=Native American, A=Asian, H=Hispanic, O=Other, U=Unknown)    Answer:   B    Order Specific Question:   Donor Egg - indicate if the egg was obtained from in vitro fertilization.    Answer:   N    Order Specific Question:   Age of Egg Donor.    Answer:   2    Order Specific Question:   Prior Down Syndrome/ONTD screening during current pregnancy.    Answer:   N    Order Specific Question:   Prior First Trimester Testing    Answer:   N    Order Specific Question:   Prior Second Trimester Testing     Answer:   N    Order Specific Question:   Family History of Neural Tube Defects    Answer:   N    Order Specific Question:   Prior Pregnancy with Down Syndrome    Answer:   N    Order Specific Question:   Please give the patient's weight (in pounds)    Answer:   116    Follow up in 4 weeks. 50% of 30 min visit spent on counseling and coordination of care.

## 2016-04-27 NOTE — Progress Notes (Signed)
Patient is in the office for initial ob visit, no complaints. 

## 2016-04-28 ENCOUNTER — Other Ambulatory Visit: Payer: Self-pay | Admitting: Certified Nurse Midwife

## 2016-04-28 DIAGNOSIS — R7989 Other specified abnormal findings of blood chemistry: Secondary | ICD-10-CM

## 2016-04-28 LAB — CERVICOVAGINAL ANCILLARY ONLY
BACTERIAL VAGINITIS: POSITIVE — AB
CANDIDA VAGINITIS: NEGATIVE
Chlamydia: NEGATIVE
Neisseria Gonorrhea: NEGATIVE
Trichomonas: NEGATIVE

## 2016-04-28 LAB — VITAMIN D 25 HYDROXY (VIT D DEFICIENCY, FRACTURES): VIT D 25 HYDROXY: 16.4 ng/mL — AB (ref 30.0–100.0)

## 2016-04-28 MED ORDER — VITAMIN D (ERGOCALCIFEROL) 1.25 MG (50000 UNIT) PO CAPS: 50000.0000 [IU] | ORAL_CAPSULE | ORAL | 5 refills | Status: DC

## 2016-04-29 LAB — CYTOLOGY - PAP: Diagnosis: NEGATIVE

## 2016-04-29 LAB — CULTURE, OB URINE

## 2016-04-29 LAB — URINE CULTURE, OB REFLEX: Organism ID, Bacteria: NO GROWTH

## 2016-04-30 ENCOUNTER — Other Ambulatory Visit: Payer: Self-pay | Admitting: Certified Nurse Midwife

## 2016-04-30 DIAGNOSIS — B9689 Other specified bacterial agents as the cause of diseases classified elsewhere: Secondary | ICD-10-CM

## 2016-04-30 DIAGNOSIS — N760 Acute vaginitis: Principal | ICD-10-CM

## 2016-04-30 LAB — OBSTETRIC PANEL, INCLUDING HIV
ANTIBODY SCREEN: NEGATIVE
BASOS: 0 %
Basophils Absolute: 0 10*3/uL (ref 0.0–0.2)
EOS (ABSOLUTE): 0.1 10*3/uL (ref 0.0–0.4)
EOS: 1 %
HEMOGLOBIN: 13.8 g/dL (ref 11.1–15.9)
HIV SCREEN 4TH GENERATION: NONREACTIVE
Hematocrit: 40.3 % (ref 34.0–46.6)
Hepatitis B Surface Ag: NEGATIVE
Immature Grans (Abs): 0 10*3/uL (ref 0.0–0.1)
Immature Granulocytes: 0 %
Lymphocytes Absolute: 1.6 10*3/uL (ref 0.7–3.1)
Lymphs: 17 %
MCH: 32.9 pg (ref 26.6–33.0)
MCHC: 34.2 g/dL (ref 31.5–35.7)
MCV: 96 fL (ref 79–97)
MONOCYTES: 5 %
Monocytes Absolute: 0.4 10*3/uL (ref 0.1–0.9)
NEUTROS ABS: 7.1 10*3/uL — AB (ref 1.4–7.0)
Neutrophils: 77 %
Platelets: 324 10*3/uL (ref 150–379)
RBC: 4.19 x10E6/uL (ref 3.77–5.28)
RDW: 13.5 % (ref 12.3–15.4)
RPR: NONREACTIVE
RUBELLA: 1.6 {index} (ref >0.99)
Rh Factor: POSITIVE
WBC: 9.3 10*3/uL (ref 3.4–10.8)

## 2016-04-30 LAB — HEMOGLOBINOPATHY EVALUATION
HEMOGLOBIN A2 QUANTITATION: 2.5 % (ref 1.8–3.2)
HGB A: 97.5 % (ref 96.4–98.8)
HGB C: 0 %
HGB S: 0 %
HGB VARIANT: 0 %
Hemoglobin F Quantitation: 0 % (ref 0.0–2.0)

## 2016-04-30 LAB — VARICELLA ZOSTER ANTIBODY, IGG: Varicella zoster IgG: 925 index (ref >165)

## 2016-04-30 LAB — HEMOGLOBIN A1C
Est. average glucose Bld gHb Est-mCnc: 103 mg/dL
Hgb A1c MFr Bld: 5.2 % (ref 4.8–5.6)

## 2016-04-30 MED ORDER — METRONIDAZOLE 0.75 % VA GEL: 1.0000 | Freq: Two times a day (BID) | VAGINAL | 0 refills | Status: DC

## 2016-05-03 ENCOUNTER — Other Ambulatory Visit: Payer: Self-pay | Admitting: Certified Nurse Midwife

## 2016-05-03 DIAGNOSIS — Z3400 Encounter for supervision of normal first pregnancy, unspecified trimester: Secondary | ICD-10-CM

## 2016-05-03 LAB — MATERNIT21 PLUS CORE+SCA
CHROMOSOME 18: NEGATIVE
Chromosome 13: NEGATIVE
Chromosome 21: NEGATIVE
Y Chromosome: NOT DETECTED

## 2016-05-10 ENCOUNTER — Other Ambulatory Visit: Payer: Self-pay | Admitting: Certified Nurse Midwife

## 2016-05-10 DIAGNOSIS — Z3400 Encounter for supervision of normal first pregnancy, unspecified trimester: Secondary | ICD-10-CM

## 2016-05-10 LAB — CYSTIC FIBROSIS MUTATION 97: Interpretation: NOT DETECTED

## 2016-05-25 ENCOUNTER — Ambulatory Visit (INDEPENDENT_AMBULATORY_CARE_PROVIDER_SITE_OTHER): Payer: Medicaid Other | Admitting: Certified Nurse Midwife

## 2016-05-25 VITALS — BP 132/61 | HR 97 | Wt 118.4 lb

## 2016-05-25 DIAGNOSIS — R7989 Other specified abnormal findings of blood chemistry: Secondary | ICD-10-CM

## 2016-05-25 DIAGNOSIS — E559 Vitamin D deficiency, unspecified: Secondary | ICD-10-CM

## 2016-05-25 DIAGNOSIS — Z3400 Encounter for supervision of normal first pregnancy, unspecified trimester: Secondary | ICD-10-CM

## 2016-05-25 DIAGNOSIS — Z3402 Encounter for supervision of normal first pregnancy, second trimester: Secondary | ICD-10-CM

## 2016-05-25 NOTE — Progress Notes (Signed)
Patient is doing better.

## 2016-05-26 ENCOUNTER — Encounter: Payer: Self-pay | Admitting: Certified Nurse Midwife

## 2016-05-26 NOTE — Progress Notes (Signed)
   PRENATAL VISIT NOTE  Subjective:  Erin Matthews is a 26 y.o. G1P0 at [redacted]w[redacted]d being seen today for ongoing prenatal care.  She is currently monitored for the following issues for this low-risk pregnancy and has Low vitamin D level; Marijuana use; and Supervision of normal pregnancy, antepartum on her problem list.  Patient reports no complaints.  Contractions: Not present. Vag. Bleeding: None.  Movement: Present. Denies leaking of fluid.   The following portions of the patient's history were reviewed and updated as appropriate: allergies, current medications, past family history, past medical history, past social history, past surgical history and problem list. Problem list updated.  Objective:   Vitals:   05/25/16 1010  BP: 132/61  Pulse: 97  Weight: 118 lb 6.4 oz (53.7 kg)    Fetal Status: Fetal Heart Rate (bpm): 151   Movement: Present     General:  Alert, oriented and cooperative. Patient is in no acute distress.  Skin: Skin is warm and dry. No rash noted.   Cardiovascular: Normal heart rate noted  Respiratory: Normal respiratory effort, no problems with respiration noted  Abdomen: Soft, gravid, appropriate for gestational age. Pain/Pressure: Absent     Pelvic:  Cervical exam deferred        Extremities: Normal range of motion.  Edema: None  Mental Status: Normal mood and affect. Normal behavior. Normal judgment and thought content.   Assessment and Plan:  Pregnancy: G1P0 at [redacted]w[redacted]d  1. Low vitamin D level     Taking weekly vitamin D supp.   2. Supervision of normal first pregnancy, antepartum     Doing well - AFP, Serum, Open Spina Bifida - [place] MFM OB COMP + 14 WK; Future  Preterm labor symptoms and general obstetric precautions including but not limited to vaginal bleeding, contractions, leaking of fluid and fetal movement were reviewed in detail with the patient. Please refer to After Visit Summary for other counseling recommendations.  Return for ROB,  babyscripts.   Roe Coombs, CNM

## 2016-05-28 LAB — AFP, SERUM, OPEN SPINA BIFIDA
AFP MOM: 0.88
AFP Value: 31.4 ng/mL
GEST. AGE ON COLLECTION DATE: 15 wk
MATERNAL AGE AT EDD: 27 a
OSBR RISK 1 IN: 10000
TEST RESULTS AFP: NEGATIVE
Weight: 118 [lb_av]

## 2016-06-01 ENCOUNTER — Other Ambulatory Visit: Payer: Self-pay | Admitting: Certified Nurse Midwife

## 2016-06-01 DIAGNOSIS — Z3400 Encounter for supervision of normal first pregnancy, unspecified trimester: Secondary | ICD-10-CM

## 2016-06-17 ENCOUNTER — Other Ambulatory Visit: Payer: Self-pay | Admitting: Obstetrics

## 2016-06-17 DIAGNOSIS — O219 Vomiting of pregnancy, unspecified: Secondary | ICD-10-CM

## 2016-06-17 MED ORDER — DOXYLAMINE-PYRIDOXINE 10-10 MG PO TBEC: DELAYED_RELEASE_TABLET | ORAL | 5 refills | Status: DC

## 2016-06-23 ENCOUNTER — Ambulatory Visit (HOSPITAL_COMMUNITY)
Admission: RE | Admit: 2016-06-23 | Discharge: 2016-06-23 | Disposition: A | Discharge status: Home / Self Care | Payer: Medicaid Other | Source: Ambulatory Visit | Attending: Certified Nurse Midwife | Admitting: Certified Nurse Midwife

## 2016-06-23 DIAGNOSIS — Z363 Encounter for antenatal screening for malformations: Secondary | ICD-10-CM | POA: Diagnosis present

## 2016-06-23 DIAGNOSIS — Z3402 Encounter for supervision of normal first pregnancy, second trimester: Secondary | ICD-10-CM | POA: Diagnosis not present

## 2016-06-23 DIAGNOSIS — Z3400 Encounter for supervision of normal first pregnancy, unspecified trimester: Secondary | ICD-10-CM

## 2016-06-27 ENCOUNTER — Other Ambulatory Visit: Payer: Self-pay | Admitting: Certified Nurse Midwife

## 2016-06-27 DIAGNOSIS — Z3400 Encounter for supervision of normal first pregnancy, unspecified trimester: Secondary | ICD-10-CM

## 2016-06-29 ENCOUNTER — Encounter: Payer: Self-pay | Admitting: Certified Nurse Midwife

## 2016-06-29 ENCOUNTER — Ambulatory Visit (INDEPENDENT_AMBULATORY_CARE_PROVIDER_SITE_OTHER): Payer: Medicaid Other | Admitting: Certified Nurse Midwife

## 2016-06-29 VITALS — BP 107/71 | HR 83 | Wt 129.8 lb

## 2016-06-29 DIAGNOSIS — O219 Vomiting of pregnancy, unspecified: Secondary | ICD-10-CM

## 2016-06-29 DIAGNOSIS — Z3402 Encounter for supervision of normal first pregnancy, second trimester: Secondary | ICD-10-CM

## 2016-06-29 DIAGNOSIS — R7989 Other specified abnormal findings of blood chemistry: Secondary | ICD-10-CM

## 2016-06-29 DIAGNOSIS — E559 Vitamin D deficiency, unspecified: Secondary | ICD-10-CM

## 2016-06-29 DIAGNOSIS — Z3400 Encounter for supervision of normal first pregnancy, unspecified trimester: Secondary | ICD-10-CM

## 2016-06-29 MED ORDER — ONDANSETRON HCL 8 MG PO TABS: 8.0000 mg | ORAL_TABLET | Freq: Three times a day (TID) | ORAL | 2 refills | Status: DC | PRN

## 2016-06-29 NOTE — Progress Notes (Signed)
Patient is in the office, reports good fetal movement. Patient reports that she had blood tinged vomiting episode this morning.

## 2016-06-29 NOTE — Patient Instructions (Addendum)
AREA PEDIATRIC/FAMILY PRACTICE PHYSICIANS  [facility] [address], [address] [place], [place]  [address] Phone - [phone]   Fax - [phone]  [facility] [address] [place], [place] [address] Phone - [phone]   Fax - [phone]  [facility] [address] [place], [place]  [address] Phone - [phone]   Fax - [phone]  [facility] [address] [place], [place]  [address] Phone - [phone]   Fax - [phone]  [facility] [address] [place], [place]  [address] Phone - [phone]   Fax - [phone]  [facility] [address] [place], [place]  [address] Phone - [phone]   Fax - [phone]  [facility] [address] [place], [place]  [address] Phone - [phone]   Fax - [phone]  [facility] [address] [place], [place]  [address] Phone - [phone]   Fax - [phone]  [facility] [address] [place], [place]  [address] Phone - [phone]   Fax - [phone] [facility] [address] [place], [place]  [address] Phone - [phone]   Fax - [phone]  [facility] [address] [place], [place]  [address] Phone - [phone]   Fax - [phone]  [facility] [address] [place], [place]  [address] Phone - [phone]   Fax - [phone]  [facility] [address] [place], [place]  [address] Phone - [phone]   Fax - [phone]  [facility] [address] [place], [place]  [address] Phone - [phone]  [facility] [address] [place], [place]  [address] Phone - [phone]   Fax - [phone]  [facility] [address] [place], [place]  [address] Phone - [phone]   Fax - [phone]  [facility] [address] [place], [place]  [address] Phone - [phone]   Fax - [phone]  [facility] [address] [place], [place]  [address] Phone - [phone]   Fax - [phone]   [facility] [address] [place], [place]  [address] Phone - [phone]   Fax - [phone]  [facility] [address] [place], [place]  [address] Phone - [phone]   Fax - [phone]  [facility] [address] [place], [place]  [address] Phone - [phone]   Fax - [phone]  [facility] [address] , [place]  [address] Phone - [phone]   Fax - [phone]  [facility] [address] , [place]  [address] Phone - [phone]   Fax - [phone] [facility] [address] [place], [place]  [address] Phone - [phone]   Fax - [phone]  [facility] [address] [place], [place]  [address] Phone - [phone]   Fax - [phone]  [facility] [address] [place], [place]  [address] Phone - [phone]   Fax - [phone]  [facility] Charlene Flemming MD [address] [place]  [address] Phone [phone]  Fax [phone]  Contraception Choices Contraception (birth control) is the use of any methods or devices to prevent   pregnancy. Below are some methods to help avoid pregnancy. Hormonal methods  Contraceptive implant. This is a thin, plastic tube containing progesterone hormone. It does not contain estrogen hormone. Your health care provider inserts the tube in the inner part of the upper arm. The tube can remain in place for up to 3 years. After 3 years, the implant must be removed. The implant prevents the  ovaries from releasing an egg (ovulation), thickens the cervical mucus to prevent sperm from entering the uterus, and thins the lining of the inside of the uterus.  Progesterone-only injections. These injections are given every 3 months by your health care provider to prevent pregnancy. This synthetic progesterone hormone stops the ovaries from releasing eggs. It also thickens cervical mucus and changes the uterine lining. This makes it harder for sperm to survive in the uterus.  Birth control pills. These pills contain estrogen and progesterone hormone. They work by preventing the ovaries from releasing eggs (ovulation). They also cause the cervical mucus to thicken, preventing the sperm from entering the uterus. Birth control pills are prescribed by a health care provider.Birth control pills can also be used to treat heavy periods.  Minipill. This type of birth control pill contains only the progesterone hormone. They are taken every day of each month and must be prescribed by your health care provider.  Birth control patch. The patch contains hormones similar to those in birth control pills. It must be changed once a week and is prescribed by a health care provider.  Vaginal ring. The ring contains hormones similar to those in birth control pills. It is left in the vagina for 3 weeks, removed for 1 week, and then a new one is put back in place. The patient must be comfortable inserting and removing the ring from the vagina.A health care provider's prescription is necessary.  Emergency contraception. Emergency contraceptives prevent pregnancy after unprotected sexual intercourse. This pill can be taken right after sex or up to 5 days after unprotected sex. It is most effective the sooner you take the pills after having sexual intercourse. Most emergency contraceptive pills are available without a prescription. Check with your pharmacist. Do not use emergency contraception as your only form of birth  control. Barrier methods  Female condom. This is a thin sheath (latex or rubber) that is worn over the penis during sexual intercourse. It can be used with spermicide to increase effectiveness.  Female condom. This is a soft, loose-fitting sheath that is put into the vagina before sexual intercourse.  Diaphragm. This is a soft, latex, dome-shaped barrier that must be fitted by a health care provider. It is inserted into the vagina, along with a spermicidal jelly. It is inserted before intercourse. The diaphragm should be left in the vagina for 6 to 8 hours after intercourse.  Cervical cap. This is a round, soft, latex or plastic cup that fits over the cervix and must be fitted by a health care provider. The cap can be left in place for up to 48 hours after intercourse.  Sponge. This is a soft, circular piece of polyurethane foam. The sponge has spermicide in it. It is inserted into the vagina after wetting it and before sexual intercourse.  Spermicides. These are chemicals that kill or block sperm from entering the cervix and uterus. They come in the form of creams, jellies, suppositories, foam, or tablets. They do not require a prescription. They are inserted into the vagina with an applicator before having sexual intercourse. The process   must be repeated every time you have sexual intercourse. Intrauterine contraception  Intrauterine device (IUD). This is a T-shaped device that is put in a woman's uterus during a menstrual period to prevent pregnancy. There are 2 types: ? Copper IUD. This type of IUD is wrapped in copper wire and is placed inside the uterus. Copper makes the uterus and fallopian tubes produce a fluid that kills sperm. It can stay in place for 10 years. ? Hormone IUD. This type of IUD contains the hormone progestin (synthetic progesterone). The hormone thickens the cervical mucus and prevents sperm from entering the uterus, and it also thins the uterine lining to prevent  implantation of a fertilized egg. The hormone can weaken or kill the sperm that get into the uterus. It can stay in place for 3-5 years, depending on which type of IUD is used. Permanent methods of contraception  Female tubal ligation. This is when the woman's fallopian tubes are surgically sealed, tied, or blocked to prevent the egg from traveling to the uterus.  Hysteroscopic sterilization. This involves placing a small coil or insert into each fallopian tube. Your doctor uses a technique called hysteroscopy to do the procedure. The device causes scar tissue to form. This results in permanent blockage of the fallopian tubes, so the sperm cannot fertilize the egg. It takes about 3 months after the procedure for the tubes to become blocked. You must use another form of birth control for these 3 months.  Female sterilization. This is when the female has the tubes that carry sperm tied off (vasectomy).This blocks sperm from entering the vagina during sexual intercourse. After the procedure, the man can still ejaculate fluid (semen). Natural planning methods  Natural family planning. This is not having sexual intercourse or using a barrier method (condom, diaphragm, cervical cap) on days the woman could become pregnant.  Calendar method. This is keeping track of the length of each menstrual cycle and identifying when you are fertile.  Ovulation method. This is avoiding sexual intercourse during ovulation.  Symptothermal method. This is avoiding sexual intercourse during ovulation, using a thermometer and ovulation symptoms.  Post-ovulation method. This is timing sexual intercourse after you have ovulated. Regardless of which type or method of contraception you choose, it is important that you use condoms to protect against the transmission of sexually transmitted infections (STIs). Talk with your health care provider about which form of contraception is most appropriate for you. This information is not  intended to replace advice given to you by your health care provider. Make sure you discuss any questions you have with your health care provider. Document Released: 02/01/2005 Document Revised: 07/10/2015 Document Reviewed: 07/27/2012 Elsevier Interactive Patient Education  2017 [employer]vier Inc.  

## 2016-06-29 NOTE — Progress Notes (Signed)
   PRENATAL VISIT NOTE  Subjective:  Erin Matthews is a 26 y.o. G1P0 at 2890w0d being seen today for ongoing prenatal care.  She is currently monitored for the following issues for this low-risk pregnancy and has Low vitamin D level; Marijuana use; and Supervision of normal pregnancy, antepartum on her problem list.  Patient reports nausea, no bleeding, no contractions, no cramping, no leaking and vomiting.  Contractions: Not present. Vag. Bleeding: None.  Movement: Present. Denies leaking of fluid.   The following portions of the patient's history were reviewed and updated as appropriate: allergies, current medications, past family history, past medical history, past social history, past surgical history and problem list. Problem list updated.  Objective:   Vitals:   06/29/16 0954  BP: 107/71  Pulse: 83  Weight: 129 lb 12.8 oz (58.9 kg)    Fetal Status:     Movement: Present     General:  Alert, oriented and cooperative. Patient is in no acute distress.  Skin: Skin is warm and dry. No rash noted.   Cardiovascular: Normal heart rate noted  Respiratory: Normal respiratory effort, no problems with respiration noted  Abdomen: Soft, gravid, appropriate for gestational age. Pain/Pressure: Absent     Pelvic:  Cervical exam deferred        Extremities: Normal range of motion.  Edema: None  Mental Status: Normal mood and affect. Normal behavior. Normal judgment and thought content.   Assessment and Plan:  Pregnancy: G1P0 at 4190w0d  1. Supervision of normal first pregnancy, antepartum      Reports daily vomiting. Zofran ordered  2. Low vitamin D level     Taking weekly vitamin D  3. Nausea and vomiting during pregnancy prior to [redacted] weeks gestation     In addition to Diclegis - ondansetron (ZOFRAN) 8 MG tablet; Take 1 tablet (8 mg total) by mouth every 8 (eight) hours as needed for nausea or vomiting.  Dispense: 40 tablet; Refill: 2  Preterm labor symptoms and general obstetric  precautions including but not limited to vaginal bleeding, contractions, leaking of fluid and fetal movement were reviewed in detail with the patient. Please refer to After Visit Summary for other counseling recommendations.  Return for ROB, babyscripts, 2 hr OGTT.   Roe Coombsenney, Takaya Hyslop A, CNM

## 2016-08-10 ENCOUNTER — Ambulatory Visit (INDEPENDENT_AMBULATORY_CARE_PROVIDER_SITE_OTHER): Payer: Medicaid Other | Admitting: Certified Nurse Midwife

## 2016-08-10 ENCOUNTER — Encounter: Payer: Self-pay | Admitting: Certified Nurse Midwife

## 2016-08-10 ENCOUNTER — Other Ambulatory Visit: Payer: Medicaid Other

## 2016-08-10 VITALS — BP 112/68 | HR 92 | Wt 136.4 lb

## 2016-08-10 DIAGNOSIS — Z3400 Encounter for supervision of normal first pregnancy, unspecified trimester: Secondary | ICD-10-CM

## 2016-08-10 DIAGNOSIS — R7989 Other specified abnormal findings of blood chemistry: Secondary | ICD-10-CM

## 2016-08-10 DIAGNOSIS — Z3402 Encounter for supervision of normal first pregnancy, second trimester: Secondary | ICD-10-CM

## 2016-08-10 NOTE — Patient Instructions (Signed)
AREA PEDIATRIC/FAMILY PRACTICE PHYSICIANS  [facility] [address], [address] [place], [place]  [address] Phone - [phone]   Fax - [phone]  [facility] [address] [place], [place] [address] Phone - [phone]   Fax - [phone]  [facility] AM[facility] [address] [place], [place]  [address] Phone - [phone]   Fax - [phone]  [facility] [address] [place], [place]  [address] Phone - [phone]   Fax - [phone]  [facility] [address] [place], [place]  [address] Phone - [phone]   Fax - [phone]  [facility] [address], [address] [place], [place]  [address] Phone - [phone]   Fax - [phone]  [facility] [address] [place], [place]  [address] Phone - [phone]   Fax - [phone]  [facility] [address], [address] [place], [place]  [address] Phone - [phone]   Fax - [phone]  [facility] [address] [place], [place]  [address] Phone - [phone]   Fax - [phone] [facility] [address] [place], [place]  [address] Phone - [phone]   Fax - [phone]  [facility] [address] [place], [place]  [address] Phone - [phone]   Fax - [phone]  [facility] [address] [place], [place]  [address] Phone - [phone]   Fax - [phone]  [facility] [address], [address] [place], [place]  [address] Phone - [phone]   Fax - [phone]  SHILPA GOSRANI [address] [place], [place]  [address] Phone - [phone]  [facility] [address] [place], [place]  [address] Phone - [phone]   Fax - [phone]  [facility] [address] [place], [place]  [address] Phone - [phone]   Fax - [phone]  [facility] [address] [place], [place]  [address] Phone - [phone]   Fax - [phone]  [facility] [address] [place], [place]  [address] Phone - [phone]   Fax - [phone]   [facility] [address] [place], [place]  [address] Phone - [phone]   Fax - [phone]  [facility] [address] [place], [place]  [address] Phone - [phone]   Fax - [phone]  DAVID RUBIN [address] [place], [place]  [address] Phone - [phone]   Fax - [phone]  [facility] [address] [place], [place]  [address] Phone - [phone]   Fax - [phone]  [facility] [address] [place], [place]  [address] Phone - [phone]   Fax - [phone] [facility] [address] [place], [place]  [address] Phone - [phone]   Fax - [phone]  [facility] [address] [place], [place]  [address] Phone - [phone]   Fax - [phone]  [facility] [address] [place], [place]  [address] Phone - [phone]   Fax - [phone]  [facility] Charlene Flemming MD [address] [place] [address] Phone [phone]  Fax [phone]   

## 2016-08-10 NOTE — Progress Notes (Signed)
   PRENATAL VISIT NOTE  Subjective:  Erin Matthews is a 26 y.o. G1P0 at 2974w0d being seen today for ongoing prenatal care.  She is currently monitored for the following issues for this low-risk pregnancy and has Low vitamin D level; Marijuana use; and Supervision of normal pregnancy, antepartum on her problem list.  Patient reports no complaints and reports recent gastritis.  Contractions: Not present. Vag. Bleeding: None.  Movement: Present. Denies leaking of fluid.   The following portions of the patient's history were reviewed and updated as appropriate: allergies, current medications, past family history, past medical history, past social history, past surgical history and problem list. Problem list updated.  Objective:   Vitals:   08/10/16 0848  BP: 112/68  Pulse: 92  Weight: 136 lb 6.4 oz (61.9 kg)    Fetal Status: Fetal Heart Rate (bpm): 141 Fundal Height: 24 cm Movement: Present     General:  Alert, oriented and cooperative. Patient is in no acute distress.  Skin: Skin is warm and dry. No rash noted.   Cardiovascular: Normal heart rate noted  Respiratory: Normal respiratory effort, no problems with respiration noted  Abdomen: Soft, gravid, appropriate for gestational age. Pain/Pressure: Present     Pelvic:  Cervical exam deferred        Extremities: Normal range of motion.  Edema: None  Mental Status: Normal mood and affect. Normal behavior. Normal judgment and thought content.   Assessment and Plan:  Pregnancy: G1P0 at 2774w0d  1. Supervision of normal first pregnancy, antepartum      Recent gastritis, fluids/food encouraged.  - Glucose Tolerance, 2 Hours w/1 Hour - CBC - HIV antibody - RPR  2. Low vitamin D level     Taking weekly vitamin D.   Preterm labor symptoms and general obstetric precautions including but not limited to vaginal bleeding, contractions, leaking of fluid and fetal movement were reviewed in detail with the patient. Please refer to After  Visit Summary for other counseling recommendations.  Return in about 4 weeks (around 09/07/2016) for ROB, babyscripts.   Roe Coombsachelle A Cigi Bega, CNM

## 2016-08-11 LAB — CBC
HEMATOCRIT: 37 % (ref 34.0–46.6)
Hemoglobin: 12.4 g/dL (ref 11.1–15.9)
MCH: 32.5 pg (ref 26.6–33.0)
MCHC: 33.5 g/dL (ref 31.5–35.7)
MCV: 97 fL (ref 79–97)
Platelets: 271 10*3/uL (ref 150–379)
RBC: 3.81 x10E6/uL (ref 3.77–5.28)
RDW: 13 % (ref 12.3–15.4)
WBC: 6.9 10*3/uL (ref 3.4–10.8)

## 2016-08-11 LAB — GLUCOSE TOLERANCE, 2 HOURS W/ 1HR
GLUCOSE, 2 HOUR: 145 mg/dL (ref 65–152)
Glucose, 1 hour: 165 mg/dL (ref 65–179)
Glucose, Fasting: 95 mg/dL — ABNORMAL HIGH (ref 65–91)

## 2016-08-11 LAB — HIV ANTIBODY (ROUTINE TESTING W REFLEX): HIV Screen 4th Generation wRfx: NONREACTIVE

## 2016-08-11 LAB — RPR: RPR: NONREACTIVE

## 2016-08-12 ENCOUNTER — Other Ambulatory Visit: Payer: Self-pay | Admitting: Certified Nurse Midwife

## 2016-08-12 DIAGNOSIS — O24419 Gestational diabetes mellitus in pregnancy, unspecified control: Secondary | ICD-10-CM | POA: Insufficient documentation

## 2016-08-12 DIAGNOSIS — O0990 Supervision of high risk pregnancy, unspecified, unspecified trimester: Secondary | ICD-10-CM

## 2016-08-12 DIAGNOSIS — O24410 Gestational diabetes mellitus in pregnancy, diet controlled: Secondary | ICD-10-CM

## 2016-08-24 ENCOUNTER — Other Ambulatory Visit: Payer: Self-pay | Admitting: *Deleted

## 2016-08-24 DIAGNOSIS — O24410 Gestational diabetes mellitus in pregnancy, diet controlled: Secondary | ICD-10-CM

## 2016-08-24 MED ORDER — ACCU-CHEK FASTCLIX LANCETS MISC: 1.0000 "application " | Freq: Four times a day (QID) | 12 refills | Status: DC

## 2016-08-24 MED ORDER — ACCU-CHEK GUIDE W/DEVICE KIT: 1.0000 | PACK | Freq: Once | 0 refills | Status: AC

## 2016-08-24 MED ORDER — GLUCOSE BLOOD VI STRP: ORAL_STRIP | 12 refills | Status: DC

## 2016-09-01 ENCOUNTER — Encounter: Payer: Self-pay | Admitting: Registered"

## 2016-09-01 ENCOUNTER — Encounter: Payer: Medicaid Other | Attending: Certified Nurse Midwife | Admitting: Registered"

## 2016-09-01 DIAGNOSIS — Z3A00 Weeks of gestation of pregnancy not specified: Secondary | ICD-10-CM | POA: Insufficient documentation

## 2016-09-01 DIAGNOSIS — R7309 Other abnormal glucose: Secondary | ICD-10-CM

## 2016-09-01 DIAGNOSIS — O24410 Gestational diabetes mellitus in pregnancy, diet controlled: Secondary | ICD-10-CM | POA: Insufficient documentation

## 2016-09-01 NOTE — Progress Notes (Signed)
Patient was seen on 09/01/2016 for Gestational Diabetes self-management class at [facility]. The following learning objectives were met by the patient during this course:   States the definition of Gestational Diabetes  States why dietary management is important in controlling blood glucose  Describes the effects each nutrient has on blood glucose levels  Demonstrates ability to create a balanced meal plan  Demonstrates carbohydrate counting   States when to check blood glucose levels  Demonstrates proper blood glucose monitoring techniques  States the effect of stress and exercise on blood glucose levels  States the importance of limiting caffeine and abstaining from alcohol and smoking  Blood glucose monitor given: none Lot # n/a Exp: n/a Blood glucose reading: 78  Patient instructed to monitor glucose levels: FBS: 60 - <95 1 hour: <140 2 hour: <120  Patient received handouts:  Nutrition Diabetes and Pregnancy  Carbohydrate Counting List  Patient will be seen for follow-up as needed.

## 2016-09-02 ENCOUNTER — Telehealth: Payer: Self-pay | Admitting: *Deleted

## 2016-09-02 NOTE — Telephone Encounter (Signed)
Patient wants to know what glucose levels are too high and too low. Explained why she has target ranges and at the beginning of her diet she may be slightly off her ranges- but if she keeps her food diary- she will know what she ate that caused the spike. If she has persistent highs she may be given medication to help that- but just try to concentrate on staying close to her ranges.

## 2016-09-02 NOTE — Telephone Encounter (Signed)
Patient called asking about glucose levels. Called patient back- LM on VM with target ranges.

## 2016-09-03 ENCOUNTER — Other Ambulatory Visit: Payer: Self-pay | Admitting: Certified Nurse Midwife

## 2016-09-03 DIAGNOSIS — O24410 Gestational diabetes mellitus in pregnancy, diet controlled: Secondary | ICD-10-CM

## 2016-09-07 ENCOUNTER — Ambulatory Visit (INDEPENDENT_AMBULATORY_CARE_PROVIDER_SITE_OTHER): Payer: Medicaid Other | Admitting: Obstetrics and Gynecology

## 2016-09-07 VITALS — BP 118/69 | HR 112 | Wt 144.6 lb

## 2016-09-07 DIAGNOSIS — O24410 Gestational diabetes mellitus in pregnancy, diet controlled: Secondary | ICD-10-CM

## 2016-09-07 DIAGNOSIS — O0990 Supervision of high risk pregnancy, unspecified, unspecified trimester: Secondary | ICD-10-CM

## 2016-09-07 DIAGNOSIS — O0993 Supervision of high risk pregnancy, unspecified, third trimester: Secondary | ICD-10-CM

## 2016-09-07 NOTE — Progress Notes (Signed)
   PRENATAL VISIT NOTE  Subjective:  Erin Matthews is a 26 y.o. G1P0 at 6674w0d being seen today for ongoing prenatal care.  She is currently monitored for the following issues for this high-risk pregnancy and has Low vitamin D level; Marijuana use; Supervision of high risk pregnancy, antepartum; and GDM (gestational diabetes mellitus) on her problem list.  Patient reports no complaints.  Contractions: Not present. Vag. Bleeding: None.  Movement: Present. Denies leaking of fluid.   The following portions of the patient's history were reviewed and updated as appropriate: allergies, current medications, past family history, past medical history, past social history, past surgical history and problem list. Problem list updated.  Objective:   Vitals:   09/07/16 0920  BP: 118/69  Pulse: (!) 112  Weight: 144 lb 9.6 oz (65.6 kg)    Fetal Status: Fetal Heart Rate (bpm): 140   Movement: Present     General:  Alert, oriented and cooperative. Patient is in no acute distress.  Skin: Skin is warm and dry. No rash noted.   Cardiovascular: Normal heart rate noted  Respiratory: Normal respiratory effort, no problems with respiration noted  Abdomen: Soft, gravid, appropriate for gestational age.  Pain/Pressure: Absent     Pelvic: Cervical exam deferred        Extremities: Normal range of motion.  Edema: None  Mental Status:  Normal mood and affect. Normal behavior. Normal judgment and thought content.   Assessment and Plan:  Pregnancy: G1P0 at 3574w0d  1. Diet controlled gestational diabetes mellitus (GDM) in third trimester CBGs reviewed and majority of fasting within range (2 values of 98). Great majority of pp within range with 1 value of 136 Continue diet control Advised to incorporate exercise and stretching  2. Supervision of high risk pregnancy, antepartum Patient is doing well without complaints  Preterm labor symptoms and general obstetric precautions including but not limited to  vaginal bleeding, contractions, leaking of fluid and fetal movement were reviewed in detail with the patient. Please refer to After Visit Summary for other counseling recommendations.  No Follow-up on file.   Catalina AntiguaPeggy Landa Mullinax, MD

## 2016-09-21 ENCOUNTER — Ambulatory Visit (INDEPENDENT_AMBULATORY_CARE_PROVIDER_SITE_OTHER): Payer: Medicaid Other | Admitting: Obstetrics and Gynecology

## 2016-09-21 VITALS — BP 109/63 | HR 106 | Wt 145.0 lb

## 2016-09-21 DIAGNOSIS — O0990 Supervision of high risk pregnancy, unspecified, unspecified trimester: Secondary | ICD-10-CM

## 2016-09-21 DIAGNOSIS — O24410 Gestational diabetes mellitus in pregnancy, diet controlled: Secondary | ICD-10-CM

## 2016-09-21 DIAGNOSIS — O0993 Supervision of high risk pregnancy, unspecified, third trimester: Secondary | ICD-10-CM

## 2016-09-21 NOTE — Progress Notes (Signed)
   PRENATAL VISIT NOTE  Subjective:  Erin Matthews is a 26 y.o. G1P0 at 968w0d being seen today for ongoing prenatal care.  She is currently monitored for the following issues for this low-risk pregnancy and has Low vitamin D level; Marijuana use; Supervision of high risk pregnancy, antepartum; and GDM (gestational diabetes mellitus) on her problem list.  Patient reports no complaints.  Contractions: Not present. Vag. Bleeding: None.  Movement: Present. Denies leaking of fluid.   The following portions of the patient's history were reviewed and updated as appropriate: allergies, current medications, past family history, past medical history, past social history, past surgical history and problem list. Problem list updated.  Objective:   Vitals:   09/21/16 1358  BP: 109/63  Pulse: (!) 106  Weight: 145 lb (65.8 kg)    Fetal Status: Fetal Heart Rate (bpm): 132 Fundal Height: 32 cm Movement: Present     General:  Alert, oriented and cooperative. Patient is in no acute distress.  Skin: Skin is warm and dry. No rash noted.   Cardiovascular: Normal heart rate noted  Respiratory: Normal respiratory effort, no problems with respiration noted  Abdomen: Soft, gravid, appropriate for gestational age.  Pain/Pressure: Absent     Pelvic: Cervical exam deferred        Extremities: Normal range of motion.  Edema: None  Mental Status:  Normal mood and affect. Normal behavior. Normal judgment and thought content.   Assessment and Plan:  Pregnancy: G1P0 at 868w0d  1. Diet controlled gestational diabetes mellitus (GDM) in third trimester CBGs reviewed and all within range with the exception of one 151 after breakfast (patient had apple pie) Continue diet control  2. Supervision of high risk pregnancy, antepartum Patient is doing well without complaints She remains undecided on contraception  Preterm labor symptoms and general obstetric precautions including but not limited to vaginal bleeding,  contractions, leaking of fluid and fetal movement were reviewed in detail with the patient. Please refer to After Visit Summary for other counseling recommendations.  Return in about 2 weeks (around 10/05/2016) for ROB.   Catalina AntiguaPeggy Diontre Harps, MD

## 2016-10-06 ENCOUNTER — Ambulatory Visit (INDEPENDENT_AMBULATORY_CARE_PROVIDER_SITE_OTHER): Payer: Medicaid Other | Admitting: Obstetrics and Gynecology

## 2016-10-06 VITALS — BP 123/76 | HR 85 | Wt 141.5 lb

## 2016-10-06 DIAGNOSIS — O24410 Gestational diabetes mellitus in pregnancy, diet controlled: Secondary | ICD-10-CM

## 2016-10-06 DIAGNOSIS — O0990 Supervision of high risk pregnancy, unspecified, unspecified trimester: Secondary | ICD-10-CM

## 2016-10-06 NOTE — Progress Notes (Signed)
Subjective:  Erin Matthews is a 26 y.o. G1P0 at [redacted]w[redacted]d being seen today for ongoing prenatal care.  She is currently monitored for the following issues for this high-risk pregnancy and has Low vitamin D level; Marijuana use; Supervision of high risk pregnancy, antepartum; and GDM (gestational diabetes mellitus) on her problem list.  Patient reports no complaints.  Contractions: Not present. Vag. Bleeding: None.  Movement: Present. Denies leaking of fluid.   The following portions of the patient's history were reviewed and updated as appropriate: allergies, current medications, past family history, past medical history, past social history, past surgical history and problem list. Problem list updated.  Objective:   Vitals:   10/06/16 1343  BP: 123/76  Pulse: 85  Weight: 141 lb 8 oz (64.2 kg)    Fetal Status: Fetal Heart Rate (bpm): 145 Fundal Height: 34 cm Movement: Present     General:  Alert, oriented and cooperative. Patient is in no acute distress.  Skin: Skin is warm and dry. No rash noted.   Cardiovascular: Normal heart rate noted  Respiratory: Normal respiratory effort, no problems with respiration noted  Abdomen: Soft, gravid, appropriate for gestational age. Pain/Pressure: Present     Pelvic:  Cervical exam deferred        Extremities: Normal range of motion.  Edema: None  Mental Status: Normal mood and affect. Normal behavior. Normal judgment and thought content.   Urinalysis:      Assessment and Plan:  Pregnancy: G1P0 at [redacted]w[redacted]d  1. Diet controlled gestational diabetes mellitus (GDM) in third trimester BS in goal range except for a few which were diet choice related Continue with diet Schedule U/S for growth at next ROB  2. Supervision of high risk pregnancy, antepartum Stable GBS next visit  Preterm labor symptoms and general obstetric precautions including but not limited to vaginal bleeding, contractions, leaking of fluid and fetal movement were reviewed in  detail with the patient. Please refer to After Visit Summary for other counseling recommendations.  Return in about 2 weeks (around 10/20/2016) for OB visit.   Hermina Staggers, MD

## 2016-10-21 ENCOUNTER — Ambulatory Visit (INDEPENDENT_AMBULATORY_CARE_PROVIDER_SITE_OTHER): Payer: Medicaid Other | Admitting: Obstetrics and Gynecology

## 2016-10-21 ENCOUNTER — Other Ambulatory Visit (HOSPITAL_COMMUNITY)
Admission: RE | Admit: 2016-10-21 | Discharge: 2016-10-21 | Disposition: A | Discharge status: Home / Self Care | Payer: Medicaid Other | Source: Ambulatory Visit | Attending: Obstetrics and Gynecology | Admitting: Obstetrics and Gynecology

## 2016-10-21 VITALS — BP 117/74 | HR 87 | Wt 149.0 lb

## 2016-10-21 DIAGNOSIS — O0990 Supervision of high risk pregnancy, unspecified, unspecified trimester: Secondary | ICD-10-CM

## 2016-10-21 DIAGNOSIS — O0993 Supervision of high risk pregnancy, unspecified, third trimester: Secondary | ICD-10-CM

## 2016-10-21 DIAGNOSIS — Z3A36 36 weeks gestation of pregnancy: Secondary | ICD-10-CM | POA: Insufficient documentation

## 2016-10-21 DIAGNOSIS — O24410 Gestational diabetes mellitus in pregnancy, diet controlled: Secondary | ICD-10-CM

## 2016-10-21 LAB — OB RESULTS CONSOLE GC/CHLAMYDIA: GC PROBE AMP, GENITAL: NEGATIVE

## 2016-10-21 NOTE — Progress Notes (Signed)
Subjective:  Erin Matthews is a 26 y.o. G1P0 at 483w2d being seen today for ongoing prenatal care.  She is currently monitored for the following issues for this high-risk pregnancy and has Low vitamin D level; Marijuana use; Supervision of high risk pregnancy, antepartum; and GDM (gestational diabetes mellitus) on her problem list.  Patient reports no complaints.  Contractions: Not present. Vag. Bleeding: None.  Movement: Present. Denies leaking of fluid.   The following portions of the patient's history were reviewed and updated as appropriate: allergies, current medications, past family history, past medical history, past social history, past surgical history and problem list. Problem list updated.  Objective:   Vitals:   10/21/16 1527  BP: 117/74  Pulse: 87  Weight: 149 lb (67.6 kg)    Fetal Status: Fetal Heart Rate (bpm): 144   Movement: Present     General:  Alert, oriented and cooperative. Patient is in no acute distress.  Skin: Skin is warm and dry. No rash noted.   Cardiovascular: Normal heart rate noted  Respiratory: Normal respiratory effort, no problems with respiration noted  Abdomen: Soft, gravid, appropriate for gestational age. Pain/Pressure: Absent     Pelvic:  Cervical exam performed        Extremities: Normal range of motion.  Edema: None  Mental Status: Normal mood and affect. Normal behavior. Normal judgment and thought content.   Urinalysis:      Assessment and Plan:  Pregnancy: G1P0 at 123w2d  1. Supervision of high risk pregnancy, antepartum Stable Labor precautions - Strep Gp B NAA - Cervicovaginal ancillary only  2. Diet controlled gestational diabetes mellitus (GDM) in third trimester Forgot BS recordings but states with goal range Will continue with diet - US MFM OB DETAIL +14 WK; Future  Term labor symptoms and general obstetric precautions including but not limited to vaginal bleeding, contractions, leaking of fluid and fetal movement were  reviewed in detail with the patient. Please refer to After Visit Summary for other counseling recommendations.  Return in about 1 week (around 10/28/2016) for OB visit.   Hermina StaggersErvin, Kareemah Grounds L, MD

## 2016-10-21 NOTE — Addendum Note (Signed)
Addended by: Elby BeckPAUL, JANE F on: 10/21/2016 04:31 PM   Modules accepted: Orders

## 2016-10-21 NOTE — Progress Notes (Signed)
ROB GBS 

## 2016-10-22 LAB — CERVICOVAGINAL ANCILLARY ONLY
Chlamydia: NEGATIVE
NEISSERIA GONORRHEA: NEGATIVE

## 2016-10-22 LAB — OB RESULTS CONSOLE GBS: STREP GROUP B AG: NEGATIVE

## 2016-10-23 LAB — STREP GP B NAA: Strep Gp B NAA: NEGATIVE

## 2016-10-28 ENCOUNTER — Ambulatory Visit (INDEPENDENT_AMBULATORY_CARE_PROVIDER_SITE_OTHER): Payer: Medicaid Other | Admitting: Obstetrics and Gynecology

## 2016-10-28 VITALS — BP 117/63 | HR 82 | Wt 147.0 lb

## 2016-10-28 DIAGNOSIS — O0993 Supervision of high risk pregnancy, unspecified, third trimester: Secondary | ICD-10-CM

## 2016-10-28 DIAGNOSIS — O24410 Gestational diabetes mellitus in pregnancy, diet controlled: Secondary | ICD-10-CM

## 2016-10-28 DIAGNOSIS — O0990 Supervision of high risk pregnancy, unspecified, unspecified trimester: Secondary | ICD-10-CM

## 2016-10-28 NOTE — Progress Notes (Signed)
Did not bring BS readings.

## 2016-10-28 NOTE — Progress Notes (Signed)
   PRENATAL VISIT NOTE  Subjective:  Erin Matthews is a 26 y.o. G1P0 at 5882w2d being seen today for ongoing prenatal care.  She is currently monitored for the following issues for this high-risk pregnancy and has Low vitamin D level; Marijuana use; Supervision of high risk pregnancy, antepartum; and GDM (gestational diabetes mellitus) on her problem list.  Patient reports no complaints.  Contractions: Not present. Vag. Bleeding: None.  Movement: Present. Denies leaking of fluid.   The following portions of the patient's history were reviewed and updated as appropriate: allergies, current medications, past family history, past medical history, past social history, past surgical history and problem list. Problem list updated.  Objective:   Vitals:   10/28/16 1051  BP: 117/63  Pulse: 82  Weight: 147 lb (66.7 kg)    Fetal Status: Fetal Heart Rate (bpm): 140 Fundal Height: 37 cm Movement: Present     General:  Alert, oriented and cooperative. Patient is in no acute distress.  Skin: Skin is warm and dry. No rash noted.   Cardiovascular: Normal heart rate noted  Respiratory: Normal respiratory effort, no problems with respiration noted  Abdomen: Soft, gravid, appropriate for gestational age.  Pain/Pressure: Absent     Pelvic: Cervical exam deferred        Extremities: Normal range of motion.  Edema: None  Mental Status:  Normal mood and affect. Normal behavior. Normal judgment and thought content.   Assessment and Plan:  Pregnancy: G1P0 at 7182w2d  1. Supervision of high risk pregnancy, antepartum Patient is doing well without complaints   2. Diet controlled gestational diabetes mellitus (GDM) in third trimester Patient did not bring log but reports highest fasting as 90 and highest pp as 125 Will continue with diet control Encouraged patient to bring CBG log or meter next visit Follow up growth ultrasound tomorrow  Term labor symptoms and general obstetric precautions including  but not limited to vaginal bleeding, contractions, leaking of fluid and fetal movement were reviewed in detail with the patient. Please refer to After Visit Summary for other counseling recommendations.  Return in about 1 week (around 11/04/2016) for ROB.   Catalina AntiguaPeggy Kalecia Hartney, MD

## 2016-10-29 ENCOUNTER — Ambulatory Visit (HOSPITAL_COMMUNITY)
Admission: RE | Admit: 2016-10-29 | Discharge: 2016-10-29 | Disposition: A | Discharge status: Home / Self Care | Payer: Medicaid Other | Source: Ambulatory Visit | Attending: Obstetrics and Gynecology | Admitting: Obstetrics and Gynecology

## 2016-10-29 ENCOUNTER — Other Ambulatory Visit: Payer: Self-pay | Admitting: Obstetrics and Gynecology

## 2016-10-29 ENCOUNTER — Encounter (HOSPITAL_COMMUNITY): Payer: Self-pay

## 2016-10-29 DIAGNOSIS — Z3A37 37 weeks gestation of pregnancy: Secondary | ICD-10-CM

## 2016-10-29 DIAGNOSIS — O99323 Drug use complicating pregnancy, third trimester: Secondary | ICD-10-CM

## 2016-10-29 DIAGNOSIS — O0990 Supervision of high risk pregnancy, unspecified, unspecified trimester: Secondary | ICD-10-CM

## 2016-10-29 DIAGNOSIS — O0993 Supervision of high risk pregnancy, unspecified, third trimester: Secondary | ICD-10-CM | POA: Insufficient documentation

## 2016-10-29 DIAGNOSIS — O24410 Gestational diabetes mellitus in pregnancy, diet controlled: Secondary | ICD-10-CM

## 2016-11-02 ENCOUNTER — Encounter (HOSPITAL_COMMUNITY): Payer: Self-pay | Admitting: *Deleted

## 2016-11-02 ENCOUNTER — Emergency Department (HOSPITAL_COMMUNITY)
Admission: EM | Admit: 2016-11-02 | Discharge: 2016-11-02 | Disposition: A | Discharge status: Home / Self Care | Payer: Medicaid Other | Attending: Emergency Medicine | Admitting: Emergency Medicine

## 2016-11-02 DIAGNOSIS — Z3A00 Weeks of gestation of pregnancy not specified: Secondary | ICD-10-CM | POA: Insufficient documentation

## 2016-11-02 DIAGNOSIS — O209 Hemorrhage in early pregnancy, unspecified: Secondary | ICD-10-CM | POA: Diagnosis not present

## 2016-11-02 MED ORDER — ONDANSETRON 4 MG PO TBDP
ORAL_TABLET | ORAL | Status: AC
  Filled 2016-11-02: qty 1

## 2016-11-02 MED ORDER — ONDANSETRON 4 MG PO TBDP
4.0000 mg | ORAL_TABLET | Freq: Once | ORAL | Status: AC | PRN
  Administered 2016-11-02: 4 mg via ORAL

## 2016-11-02 NOTE — ED Notes (Signed)
Pt called for re eval. No answer.

## 2016-11-02 NOTE — ED Triage Notes (Signed)
Pt c/o lower abdominal pain since 0330 with blood streaked emesis. Pt is 8 months pregnant, due in October

## 2016-11-02 NOTE — ED Notes (Signed)
Pt appears comfortable in triage and is tolerating ice chips

## 2016-11-04 ENCOUNTER — Ambulatory Visit (INDEPENDENT_AMBULATORY_CARE_PROVIDER_SITE_OTHER): Payer: Medicaid Other | Admitting: Obstetrics and Gynecology

## 2016-11-04 VITALS — BP 130/81 | HR 82 | Wt 152.0 lb

## 2016-11-04 DIAGNOSIS — O0990 Supervision of high risk pregnancy, unspecified, unspecified trimester: Secondary | ICD-10-CM

## 2016-11-04 DIAGNOSIS — O0993 Supervision of high risk pregnancy, unspecified, third trimester: Secondary | ICD-10-CM

## 2016-11-04 DIAGNOSIS — O24410 Gestational diabetes mellitus in pregnancy, diet controlled: Secondary | ICD-10-CM

## 2016-11-04 NOTE — Patient Instructions (Signed)
Return to clinic for any scheduled appointments or obstetric concerns, or go to [facility] for evaluation  

## 2016-11-04 NOTE — Progress Notes (Signed)
PRENATAL VISIT NOTE  Subjective:  Erin Matthews is a 26 y.o. G1P0 at [redacted]w[redacted]d being seen today for ongoing prenatal care.  She is currently monitored for the following issues for this high-risk pregnancy and has Low vitamin D level; Marijuana use; Supervision of high risk pregnancy, antepartum; and GDM (gestational diabetes mellitus) on her problem list.  Patient reports occasional mild crampingh.  Contractions: Not present. Vag. Bleeding: None.  Movement: Present. Denies leaking of fluid.   The following portions of the patient's history were reviewed and updated as appropriate: allergies, current medications, past family history, past medical history, past social history, past surgical history and problem list. Problem list updated.  Objective:   Vitals:   11/04/16 1546  BP: 130/81  Pulse: 82  Weight: 152 lb (68.9 kg)    Fetal Status: Fetal Heart Rate (bpm): 132 Fundal Height: 39 cm Movement: Present     General:  Alert, oriented and cooperative. Patient is in no acute distress.  Skin: Skin is warm and dry. No rash noted.   Cardiovascular: Normal heart rate noted  Respiratory: Normal respiratory effort, no problems with respiration noted  Abdomen: Soft, gravid, appropriate for gestational age.  Pain/Pressure: Absent     Pelvic: Cervical exam deferred        Extremities: Normal range of motion.  Edema: None  Mental Status:  Normal mood and affect. Normal behavior. Normal judgment and thought content.   [place] [facility] Ob Follow Up  Result Date: 10/29/2016 ----------------------------------------------------------------------  OBSTETRICS REPORT                      (Signed Final 10/29/2016 07:03 pm) ---------------------------------------------------------------------- Patient Info  ID #:       [id number]                         D.O.B.:   14-Jul-1990 (26 yrs)  Name:       Erin Matthews             Visit Date:  10/29/2016 10:51 am  ---------------------------------------------------------------------- Performed By  Performed By:     Percell Boston          Ref. Address:     [address]                                                             [place]                                                             [address]  Attending:  Particia Nearing MD       Location:         [facility]  Referred By:      Roe Coombs CNM ---------------------------------------------------------------------- Orders   #  Description                                 Code   1  [place] [facility] OB FOLLOW UP                         [phone]  ----------------------------------------------------------------------   #  Ordered By               Order #        Accession #    Episode #   1  Nettie Elm            [id number]      [id number]     [id number]  ---------------------------------------------------------------------- Indications   [redacted] weeks gestation of pregnancy                Z3A.37   Gestational diabetes in pregnancy, diet        O24.410   controlled   Encounter for other antenatal screening        Z36.2   follow-up  ---------------------------------------------------------------------- OB History  Gravidity:    1         Term:   0        Prem:   0        SAB:   0  TOP:          0       Ectopic:  0        Living: 0 ---------------------------------------------------------------------- Fetal Evaluation  Num Of Fetuses:     1  Fetal Heart         140  Rate(bpm):  Cardiac Activity:   Observed  Presentation:       Cephalic  Placenta:           Posterior, above cervical os  P. Cord Insertion:  Visualized, central  Amniotic Fluid  AFI FV:      Subjectively within normal limits  AFI Sum(cm)     %Tile       Largest Pocket(cm)  12.86           46          4.35  RUQ(cm)       RLQ(cm)       LUQ(cm)        LLQ(cm)  4.35          2.63          2.69            3.19 ---------------------------------------------------------------------- Biometry  BPD:      85.9  mm     G. Age:  34w 5d          6  %    CI:        77.54   %   70 - 86  FL/HC:      23.3   %   20.8 - 22.6  HC:      308.8  mm     G. Age:  34w 3d        < 3  %    HC/AC:      0.94       0.92 - 1.05  AC:      329.5  mm     G. Age:  36w 6d         49  %    FL/BPD:     83.8   %   71 - 87  FL:         72  mm     G. Age:  36w 6d         37  %    FL/AC:      21.9   %   20 - 24  HUM:      60.7  mm     G. Age:  35w 1d         31  %  Est. FW:    2911  gm      6 lb 7 oz     48  % ---------------------------------------------------------------------- Gestational Age  LMP:           37w 3d       Date:   02/10/16                 EDD:   11/16/16  U/S Today:     35w 5d                                        EDD:   11/28/16  Best:          37w 3d    Det. By:   LMP  (02/10/16)          EDD:   11/16/16 ---------------------------------------------------------------------- Anatomy  Cranium:               Appears normal         Aortic Arch:            Previously seen  Cavum:                 Previously seen        Ductal Arch:            Previously seen  Ventricles:            Appears normal         Diaphragm:              Previously seen  Choroid Plexus:        Previously seen        Stomach:                Appears normal, left                                                                        sided  Cerebellum:            Previously  seen        Abdomen:                Previously seen  Posterior Fossa:       Previously seen        Abdominal Wall:         Previously seen  Nuchal Fold:           Previously seen        Cord Vessels:           Previously seen  Face:                  Orbits and profile     Kidneys:                Appear normal                         previously seen  Lips:                  Previously seen        Bladder:                Appears normal   Thoracic:              Appears normal         Spine:                  Previously seen  Heart:                 Appears normal         Upper Extremities:      Previously seen                         (4CH, axis, and situs  RVOT:                  Previously seen        Lower Extremities:      Previously seen  LVOT:                  Appears normal  Other:  Fetus appears to be a female. Heels visualized previously. Nasal          bone visualized previously. ---------------------------------------------------------------------- Cervix Uterus Adnexa  Cervix  Not visualized (advanced GA >29wks)  Uterus  No abnormality visualized.  Left Ovary  No adnexal mass visualized.  Right Ovary  No adnexal mass visualized.  Cul De Sac:   No free fluid seen.  Adnexa:       No abnormality visualized. ---------------------------------------------------------------------- Impression  SIUP at 37+3 weeks  Cephalic presentation  Normal interval anatomy; anatomic survey complete  Normal amniotic fluid volume  Appropriate interval growth with EFW at the 48th %tile ---------------------------------------------------------------------- Recommendations  Follow-up as clinically indicated ----------------------------------------------------------------------                 Particia Nearing, MD Electronically Signed Final Report   10/29/2016 07:03 pm ----------------------------------------------------------------------    Assessment and Plan:  Pregnancy: G1P0 at [redacted]w[redacted]d  1. Diet controlled gestational diabetes mellitus (GDM) in third trimester Blood sugars within range Cont diet control EFW 48th%ile IOL scheduled for 11/16/16  2. Supervision of high risk pregnancy, antepartum Term labor symptoms and general obstetric precautions including but not limited to vaginal bleeding, contractions, leaking of fluid and fetal movement were reviewed in detail with the patient. Please refer to  After Visit Summary for other counseling recommendations.    Return in about 1 week (around 11/11/2016) for OB visit.   Conan Bowens, MD

## 2016-11-08 ENCOUNTER — Other Ambulatory Visit: Payer: Self-pay | Admitting: Obstetrics and Gynecology

## 2016-11-09 ENCOUNTER — Other Ambulatory Visit: Payer: Self-pay | Admitting: Advanced Practice Midwife

## 2016-11-09 ENCOUNTER — Encounter (HOSPITAL_COMMUNITY): Payer: Self-pay | Admitting: *Deleted

## 2016-11-09 ENCOUNTER — Telehealth (HOSPITAL_COMMUNITY): Payer: Self-pay | Admitting: *Deleted

## 2016-11-09 NOTE — Telephone Encounter (Signed)
Preadmission screen  

## 2016-11-11 ENCOUNTER — Ambulatory Visit (INDEPENDENT_AMBULATORY_CARE_PROVIDER_SITE_OTHER): Payer: Medicaid Other | Admitting: Obstetrics & Gynecology

## 2016-11-11 DIAGNOSIS — O0993 Supervision of high risk pregnancy, unspecified, third trimester: Secondary | ICD-10-CM

## 2016-11-11 DIAGNOSIS — O0990 Supervision of high risk pregnancy, unspecified, unspecified trimester: Secondary | ICD-10-CM

## 2016-11-11 NOTE — Progress Notes (Signed)
Pt would like cervix check today. Pt states she is scheduled for IOL next week.

## 2016-11-11 NOTE — Progress Notes (Signed)
   PRENATAL VISIT NOTE  Subjective:  Erin Matthews is a 26 y.o. G1P0 at [redacted]w[redacted]d being seen today for ongoing prenatal care.  She is currently monitored for the following issues for this high-risk pregnancy and has Low vitamin D level; Marijuana use; Supervision of high risk pregnancy, antepartum; and GDM (gestational diabetes mellitus) on her problem list.  Patient reports occasional contractions.  Contractions: Irregular. Vag. Bleeding: None.  Movement: Present. Denies leaking of fluid.   The following portions of the patient's history were reviewed and updated as appropriate: allergies, current medications, past family history, past medical history, past social history, past surgical history and problem list. Problem list updated.  Objective:   Vitals:   11/11/16 1052  BP: 115/71  Pulse: 96  Weight: 150 lb (68 kg)    Fetal Status: Fetal Heart Rate (bpm): 140   Movement: Present  Presentation: Vertex  General:  Alert, oriented and cooperative. Patient is in no acute distress.  Skin: Skin is warm and dry. No rash noted.   Cardiovascular: Normal heart rate noted  Respiratory: Normal respiratory effort, no problems with respiration noted  Abdomen: Soft, gravid, appropriate for gestational age.  Pain/Pressure: Present     Pelvic: Cervical exam performed Dilation: 1.5 Effacement (%): 40 Station: -3  Extremities: Normal range of motion.     Mental Status:  Normal mood and affect. Normal behavior. Normal judgment and thought content.   Assessment and Plan:  Pregnancy: G1P0 at [redacted]w[redacted]d  1. Supervision of high risk pregnancy, antepartum GDM, diet control  Term labor symptoms and general obstetric precautions including but not limited to vaginal bleeding, contractions, leaking of fluid and fetal movement were reviewed in detail with the patient. Please refer to After Visit Summary for other counseling recommendations.  Return if symptoms worsen or fail to improve, for postpartum. IOL 40  weeks  Scheryl Darter, MD

## 2016-11-11 NOTE — Patient Instructions (Signed)
Vaginal Delivery Vaginal delivery means that you will give birth by pushing your baby out of your birth canal (vagina). A team of health care providers will help you before, during, and after vaginal delivery. Birth experiences are unique for every woman and every pregnancy, and birth experiences vary depending on where you choose to give birth. What should I do to prepare for my baby's birth? Before your baby is born, it is important to talk with your health care provider about:  Your labor and delivery preferences. These may include: ? Medicines that you may be given. ? How you will manage your pain. This might include non-medical pain relief techniques or injectable pain relief such as epidural analgesia. ? How you and your baby will be monitored during labor and delivery. ? Who may be in the labor and delivery room with you. ? Your feelings about surgical delivery of your baby (cesarean delivery, or C-section) if this becomes necessary. ? Your feelings about receiving donated blood through an IV tube (blood transfusion) if this becomes necessary.  Whether you are able: ? To take pictures or videos of the birth. ? To eat during labor and delivery. ? To move around, walk, or change positions during labor and delivery.  What to expect after your baby is born, such as: ? Whether delayed umbilical cord clamping and cutting is offered. ? Who will care for your baby right after birth. ? Medicines or tests that may be recommended for your baby. ? Whether breastfeeding is supported in your hospital or birth center. ? How long you will be in the hospital or birth center.  How any medical conditions you have may affect your baby or your labor and delivery experience.  To prepare for your baby's birth, you should also:  Attend all of your health care visits before delivery (prenatal visits) as recommended by your health care provider. This is important.  Prepare your home for your baby's  arrival. Make sure that you have: ? Diapers. ? Baby clothing. ? Feeding equipment. ? Safe sleeping arrangements for you and your baby.  Install a car seat in your vehicle. Have your car seat checked by a certified car seat installer to make sure that it is installed safely.  Think about who will help you with your new baby at home for at least the first several weeks after delivery.  What can I expect when I arrive at the birth center or hospital? Once you are in labor and have been admitted into the hospital or birth center, your health care provider may:  Review your pregnancy history and any concerns you have.  Insert an IV tube into one of your veins. This is used to give you fluids and medicines.  Check your blood pressure, pulse, temperature, and heart rate (vital signs).  Check whether your bag of water (amniotic sac) has broken (ruptured).  Talk with you about your birth plan and discuss pain control options.  Monitoring Your health care provider may monitor your contractions (uterine monitoring) and your baby's heart rate (fetal monitoring). You may need to be monitored:  Often, but not continuously (intermittently).  All the time or for long periods at a time (continuously). Continuous monitoring may be needed if: ? You are taking certain medicines, such as medicine to relieve pain or make your contractions stronger. ? You have pregnancy or labor complications.  Monitoring may be done by:  Placing a special stethoscope or a handheld monitoring device on your abdomen to   check your baby's heartbeat, and feeling your abdomen for contractions. This method of monitoring does not continuously record your baby's heartbeat or your contractions.  Placing monitors on your abdomen (external monitors) to record your baby's heartbeat and the frequency and length of contractions. You may not have to wear external monitors all the time.  Placing monitors inside of your uterus  (internal monitors) to record your baby's heartbeat and the frequency, length, and strength of your contractions. ? Your health care provider may use internal monitors if he or she needs more information about the strength of your contractions or your baby's heart rate. ? Internal monitors are put in place by passing a thin, flexible wire through your vagina and into your uterus. Depending on the type of monitor, it may remain in your uterus or on your baby's head until birth. ? Your health care provider will discuss the benefits and risks of internal monitoring with you and will ask for your permission before inserting the monitors.  Telemetry. This is a type of continuous monitoring that can be done with external or internal monitors. Instead of having to stay in bed, you are able to move around during telemetry. Ask your health care provider if telemetry is an option for you.  Physical exam Your health care provider may perform a physical exam. This may include:  Checking whether your baby is positioned: ? With the head toward your vagina (head-down). This is most common. ? With the head toward the top of your uterus (head-up or breech). If your baby is in a breech position, your health care provider may try to turn your baby to a head-down position so you can deliver vaginally. If it does not seem that your baby can be born vaginally, your provider may recommend surgery to deliver your baby. In rare cases, you may be able to deliver vaginally if your baby is head-up (breech delivery). ? Lying sideways (transverse). Babies that are lying sideways cannot be delivered vaginally.  Checking your cervix to determine: ? Whether it is thinning out (effacing). ? Whether it is opening up (dilating). ? How low your baby has moved into your birth canal.  What are the three stages of labor and delivery?  Normal labor and delivery is divided into the following three stages: Stage 1  Stage 1 is the  longest stage of labor, and it can last for hours or days. Stage 1 includes: ? Early labor. This is when contractions may be irregular, or regular and mild. Generally, early labor contractions are more than 10 minutes apart. ? Active labor. This is when contractions get longer, more regular, more frequent, and more intense. ? The transition phase. This is when contractions happen very close together, are very intense, and may last longer than during any other part of labor.  Contractions generally feel mild, infrequent, and irregular at first. They get stronger, more frequent (about every 2-3 minutes), and more regular as you progress from early labor through active labor and transition.  Many women progress through stage 1 naturally, but you may need help to continue making progress. If this happens, your health care provider may talk with you about: ? Rupturing your amniotic sac if it has not ruptured yet. ? Giving you medicine to help make your contractions stronger and more frequent.  Stage 1 ends when your cervix is completely dilated to 4 inches (10 cm) and completely effaced. This happens at the end of the transition phase. Stage 2  Once   your cervix is completely effaced and dilated to 4 inches (10 cm), you may start to feel an urge to push. It is common for the body to naturally take a rest before feeling the urge to push, especially if you received an epidural or certain other pain medicines. This rest period may last for up to 1-2 hours, depending on your unique labor experience.  During stage 2, contractions are generally less painful, because pushing helps relieve contraction pain. Instead of contraction pain, you may feel stretching and burning pain, especially when the widest part of your baby's head passes through the vaginal opening (crowning).  Your health care provider will closely monitor your pushing progress and your baby's progress through the vagina during stage 2.  Your  health care provider may massage the area of skin between your vaginal opening and anus (perineum) or apply warm compresses to your perineum. This helps it stretch as the baby's head starts to crown, which can help prevent perineal tearing. ? In some cases, an incision may be made in your perineum (episiotomy) to allow the baby to pass through the vaginal opening. An episiotomy helps to make the opening of the vagina larger to allow more room for the baby to fit through.  It is very important to breathe and focus so your health care provider can control the delivery of your baby's head. Your health care provider may have you decrease the intensity of your pushing, to help prevent perineal tearing.  After delivery of your baby's head, the shoulders and the rest of the body generally deliver very quickly and without difficulty.  Once your baby is delivered, the umbilical cord may be cut right away, or this may be delayed for 1-2 minutes, depending on your baby's health. This may vary among health care providers, hospitals, and birth centers.  If you and your baby are healthy enough, your baby may be placed on your chest or abdomen to help maintain the baby's temperature and to help you bond with each other. Some mothers and babies start breastfeeding at this time. Your health care team will dry your baby and help keep your baby warm during this time.  Your baby may need immediate care if he or she: ? Showed signs of distress during labor. ? Has a medical condition. ? Was born too early (prematurely). ? Had a bowel movement before birth (meconium). ? Shows signs of difficulty transitioning from being inside the uterus to being outside of the uterus. If you are planning to breastfeed, your health care team will help you begin a feeding. Stage 3  The third stage of labor starts immediately after the birth of your baby and ends after you deliver the placenta. The placenta is an organ that develops  during pregnancy to provide oxygen and nutrients to your baby in the womb.  Delivering the placenta may require some pushing, and you may have mild contractions. Breastfeeding can stimulate contractions to help you deliver the placenta.  After the placenta is delivered, your uterus should tighten (contract) and become firm. This helps to stop bleeding in your uterus. To help your uterus contract and to control bleeding, your health care provider may: ? Give you medicine by injection, through an IV tube, by mouth, or through your rectum (rectally). ? Massage your abdomen or perform a vaginal exam to remove any blood clots that are left in your uterus. ? Empty your bladder by placing a thin, flexible tube (catheter) into your bladder. ? Encourage   you to breastfeed your baby. After labor is over, you and your baby will be monitored closely to ensure that you are both healthy until you are ready to go home. Your health care team will teach you how to care for yourself and your baby. This information is not intended to replace advice given to you by your health care provider. Make sure you discuss any questions you have with your health care provider. Document Released: 11/11/2007 Document Revised: 08/22/2015 Document Reviewed: 02/16/2015 Elsevier Interactive Patient Education  2018 [employer]  

## 2016-11-14 ENCOUNTER — Encounter (HOSPITAL_COMMUNITY): Payer: Self-pay | Admitting: *Deleted

## 2016-11-14 ENCOUNTER — Inpatient Hospital Stay (HOSPITAL_COMMUNITY)
Admission: AD | Admit: 2016-11-14 | Discharge: 2016-11-17 | DRG: 806 | Disposition: A | Discharge status: Home / Self Care | Payer: Medicaid Other | Source: Ambulatory Visit | Attending: Obstetrics and Gynecology | Admitting: Obstetrics and Gynecology

## 2016-11-14 DIAGNOSIS — O24410 Gestational diabetes mellitus in pregnancy, diet controlled: Secondary | ICD-10-CM | POA: Diagnosis present

## 2016-11-14 DIAGNOSIS — O99324 Drug use complicating childbirth: Secondary | ICD-10-CM | POA: Diagnosis present

## 2016-11-14 DIAGNOSIS — Z87891 Personal history of nicotine dependence: Secondary | ICD-10-CM

## 2016-11-14 DIAGNOSIS — Z3A39 39 weeks gestation of pregnancy: Secondary | ICD-10-CM

## 2016-11-14 DIAGNOSIS — O24420 Gestational diabetes mellitus in childbirth, diet controlled: Principal | ICD-10-CM | POA: Diagnosis present

## 2016-11-14 DIAGNOSIS — O288 Other abnormal findings on antenatal screening of mother: Secondary | ICD-10-CM | POA: Diagnosis present

## 2016-11-14 DIAGNOSIS — F1290 Cannabis use, unspecified, uncomplicated: Secondary | ICD-10-CM | POA: Diagnosis present

## 2016-11-14 NOTE — MAU Note (Signed)
Pt here with c/o contractions all day, 5 minutes apart for the last hour. Denies any bleeding or leaking of fluid. Reports positive fetal movement.

## 2016-11-15 ENCOUNTER — Inpatient Hospital Stay (HOSPITAL_COMMUNITY): Payer: Medicaid Other | Admitting: Anesthesiology

## 2016-11-15 ENCOUNTER — Encounter (HOSPITAL_COMMUNITY): Payer: Self-pay

## 2016-11-15 DIAGNOSIS — O24420 Gestational diabetes mellitus in childbirth, diet controlled: Secondary | ICD-10-CM | POA: Diagnosis present

## 2016-11-15 DIAGNOSIS — O24410 Gestational diabetes mellitus in pregnancy, diet controlled: Secondary | ICD-10-CM | POA: Diagnosis present

## 2016-11-15 DIAGNOSIS — Z87891 Personal history of nicotine dependence: Secondary | ICD-10-CM | POA: Diagnosis not present

## 2016-11-15 DIAGNOSIS — O99324 Drug use complicating childbirth: Secondary | ICD-10-CM | POA: Diagnosis present

## 2016-11-15 DIAGNOSIS — Z3A39 39 weeks gestation of pregnancy: Secondary | ICD-10-CM | POA: Diagnosis not present

## 2016-11-15 DIAGNOSIS — F1290 Cannabis use, unspecified, uncomplicated: Secondary | ICD-10-CM | POA: Diagnosis present

## 2016-11-15 DIAGNOSIS — O26893 Other specified pregnancy related conditions, third trimester: Secondary | ICD-10-CM | POA: Diagnosis present

## 2016-11-15 DIAGNOSIS — O288 Other abnormal findings on antenatal screening of mother: Secondary | ICD-10-CM | POA: Diagnosis present

## 2016-11-15 LAB — CBC
HCT: 36.1 % (ref 36.0–46.0)
HEMOGLOBIN: 12.6 g/dL (ref 12.0–15.0)
MCH: 33 pg (ref 26.0–34.0)
MCHC: 34.9 g/dL (ref 30.0–36.0)
MCV: 94.5 fL (ref 78.0–100.0)
Platelets: 255 10*3/uL (ref 150–400)
RBC: 3.82 MIL/uL — AB (ref 3.87–5.11)
RDW: 13 % (ref 11.5–15.5)
WBC: 9.4 10*3/uL (ref 4.0–10.5)

## 2016-11-15 LAB — TYPE AND SCREEN
ABO/RH(D): O POS
ANTIBODY SCREEN: NEGATIVE

## 2016-11-15 LAB — GLUCOSE, CAPILLARY
GLUCOSE-CAPILLARY: 112 mg/dL — AB (ref 65–99)
GLUCOSE-CAPILLARY: 74 mg/dL (ref 65–99)
Glucose-Capillary: 100 mg/dL — ABNORMAL HIGH (ref 65–99)
Glucose-Capillary: 99 mg/dL (ref 65–99)
Glucose-Capillary: 76 mg/dL (ref 65–99)

## 2016-11-15 LAB — ABO/RH: ABO/RH(D): O POS

## 2016-11-15 LAB — RPR: RPR: NONREACTIVE

## 2016-11-15 MED ORDER — ONDANSETRON HCL 4 MG/2ML IJ SOLN: 4.0000 mg | Freq: Four times a day (QID) | INTRAMUSCULAR | Status: DC | PRN

## 2016-11-15 MED ORDER — WITCH HAZEL-GLYCERIN EX PADS: 1.0000 "application " | MEDICATED_PAD | CUTANEOUS | Status: DC | PRN

## 2016-11-15 MED ORDER — DIPHENHYDRAMINE HCL 50 MG/ML IJ SOLN: 12.5000 mg | INTRAMUSCULAR | Status: DC | PRN

## 2016-11-15 MED ORDER — LIDOCAINE HCL (PF) 1 % IJ SOLN: 30.0000 mL | INTRAMUSCULAR | Status: DC | PRN

## 2016-11-15 MED ORDER — PRENATAL MULTIVITAMIN CH
1.0000 | ORAL_TABLET | Freq: Every day | ORAL | Status: DC
  Administered 2016-11-16: 1 via ORAL
  Filled 2016-11-15: qty 1

## 2016-11-15 MED ORDER — FLEET ENEMA 7-19 GM/118ML RE ENEM
1.0000 | ENEMA | RECTAL | Status: DC | PRN
Start: 2016-11-15 — End: 2016-11-15

## 2016-11-15 MED ORDER — SENNOSIDES-DOCUSATE SODIUM 8.6-50 MG PO TABS
2.0000 | ORAL_TABLET | ORAL | Status: DC
  Administered 2016-11-15 – 2016-11-17 (×2): 2 via ORAL
  Filled 2016-11-15 (×2): qty 2

## 2016-11-15 MED ORDER — DIBUCAINE 1 % RE OINT: 1.0000 "application " | TOPICAL_OINTMENT | RECTAL | Status: DC | PRN

## 2016-11-15 MED ORDER — ACETAMINOPHEN 325 MG PO TABS: 650.0000 mg | ORAL_TABLET | ORAL | Status: DC | PRN

## 2016-11-15 MED ORDER — FENTANYL CITRATE (PF) 100 MCG/2ML IJ SOLN: 100.0000 ug | INTRAMUSCULAR | Status: DC | PRN

## 2016-11-15 MED ORDER — OXYTOCIN 40 UNITS IN LACTATED RINGERS INFUSION - SIMPLE MED: 2.5000 [IU]/h | INTRAVENOUS | Status: DC

## 2016-11-15 MED ORDER — TERBUTALINE SULFATE 1 MG/ML IJ SOLN: 0.2500 mg | Freq: Once | INTRAMUSCULAR | Status: DC | PRN

## 2016-11-15 MED ORDER — OXYTOCIN 40 UNITS IN LACTATED RINGERS INFUSION - SIMPLE MED
1.0000 m[IU]/min | INTRAVENOUS | Status: DC
  Administered 2016-11-15: 2 m[IU]/min via INTRAVENOUS

## 2016-11-15 MED ORDER — ZOLPIDEM TARTRATE 5 MG PO TABS: 5.0000 mg | ORAL_TABLET | Freq: Every evening | ORAL | Status: DC | PRN

## 2016-11-15 MED ORDER — PHENYLEPHRINE 40 MCG/ML (10ML) SYRINGE FOR IV PUSH (FOR BLOOD PRESSURE SUPPORT): 80.0000 ug | PREFILLED_SYRINGE | INTRAVENOUS | Status: DC | PRN

## 2016-11-15 MED ORDER — OXYCODONE-ACETAMINOPHEN 5-325 MG PO TABS: 1.0000 | ORAL_TABLET | ORAL | Status: DC | PRN

## 2016-11-15 MED ORDER — PHENYLEPHRINE 40 MCG/ML (10ML) SYRINGE FOR IV PUSH (FOR BLOOD PRESSURE SUPPORT)
80.0000 ug | PREFILLED_SYRINGE | INTRAVENOUS | Status: DC | PRN
  Filled 2016-11-15: qty 5

## 2016-11-15 MED ORDER — SIMETHICONE 80 MG PO CHEW: 80.0000 mg | CHEWABLE_TABLET | ORAL | Status: DC | PRN

## 2016-11-15 MED ORDER — OXYTOCIN BOLUS FROM INFUSION
500.0000 mL | Freq: Once | INTRAVENOUS | Status: AC
  Administered 2016-11-15: 500 mL via INTRAVENOUS

## 2016-11-15 MED ORDER — LACTATED RINGERS IV SOLN
500.0000 mL | Freq: Once | INTRAVENOUS | Status: AC
  Administered 2016-11-15: 500 mL via INTRAVENOUS

## 2016-11-15 MED ORDER — TERBUTALINE SULFATE 1 MG/ML IJ SOLN
0.2500 mg | Freq: Once | INTRAMUSCULAR | Status: DC | PRN
  Filled 2016-11-15: qty 1

## 2016-11-15 MED ORDER — SOD CITRATE-CITRIC ACID 500-334 MG/5ML PO SOLN: 30.0000 mL | ORAL | Status: DC | PRN

## 2016-11-15 MED ORDER — COCONUT OIL OIL: 1.0000 "application " | TOPICAL_OIL | Status: DC | PRN

## 2016-11-15 MED ORDER — BENZOCAINE-MENTHOL 20-0.5 % EX AERO
1.0000 "application " | INHALATION_SPRAY | CUTANEOUS | Status: DC | PRN
  Administered 2016-11-16: 1 via TOPICAL
  Filled 2016-11-15: qty 56

## 2016-11-15 MED ORDER — LACTATED RINGERS IV SOLN
500.0000 mL | INTRAVENOUS | Status: DC | PRN
  Administered 2016-11-15: 1000 mL via INTRAVENOUS

## 2016-11-15 MED ORDER — ONDANSETRON HCL 4 MG PO TABS: 4.0000 mg | ORAL_TABLET | ORAL | Status: DC | PRN

## 2016-11-15 MED ORDER — EPHEDRINE 5 MG/ML INJ: 10.0000 mg | INTRAVENOUS | Status: DC | PRN

## 2016-11-15 MED ORDER — EPHEDRINE 5 MG/ML INJ
10.0000 mg | INTRAVENOUS | Status: DC | PRN
  Filled 2016-11-15: qty 2

## 2016-11-15 MED ORDER — FENTANYL 2.5 MCG/ML BUPIVACAINE 1/10 % EPIDURAL INFUSION (WH - ANES)
14.0000 mL/h | INTRAMUSCULAR | Status: DC | PRN
  Administered 2016-11-15: 14 mL/h via EPIDURAL
  Administered 2016-11-15: 10 mL/h via EPIDURAL
  Filled 2016-11-15 (×2): qty 100

## 2016-11-15 MED ORDER — LACTATED RINGERS IV SOLN: 500.0000 mL | INTRAVENOUS | Status: DC | PRN

## 2016-11-15 MED ORDER — LIDOCAINE HCL (PF) 1 % IJ SOLN
30.0000 mL | INTRAMUSCULAR | Status: DC | PRN
  Filled 2016-11-15: qty 30

## 2016-11-15 MED ORDER — ONDANSETRON HCL 4 MG/2ML IJ SOLN: 4.0000 mg | INTRAMUSCULAR | Status: DC | PRN

## 2016-11-15 MED ORDER — OXYCODONE-ACETAMINOPHEN 5-325 MG PO TABS: 2.0000 | ORAL_TABLET | ORAL | Status: DC | PRN

## 2016-11-15 MED ORDER — PHENYLEPHRINE 40 MCG/ML (10ML) SYRINGE FOR IV PUSH (FOR BLOOD PRESSURE SUPPORT)
80.0000 ug | PREFILLED_SYRINGE | INTRAVENOUS | Status: DC | PRN
  Filled 2016-11-15: qty 10
  Filled 2016-11-15: qty 5

## 2016-11-15 MED ORDER — OXYTOCIN 40 UNITS IN LACTATED RINGERS INFUSION - SIMPLE MED
2.5000 [IU]/h | INTRAVENOUS | Status: DC
  Filled 2016-11-15: qty 1000

## 2016-11-15 MED ORDER — FLEET ENEMA 7-19 GM/118ML RE ENEM: 1.0000 | ENEMA | RECTAL | Status: DC | PRN

## 2016-11-15 MED ORDER — TETANUS-DIPHTH-ACELL PERTUSSIS 5-2.5-18.5 LF-MCG/0.5 IM SUSP: 0.5000 mL | Freq: Once | INTRAMUSCULAR | Status: DC

## 2016-11-15 MED ORDER — ACETAMINOPHEN 325 MG PO TABS
650.0000 mg | ORAL_TABLET | ORAL | Status: DC | PRN
  Administered 2016-11-16 (×2): 650 mg via ORAL
  Filled 2016-11-15 (×2): qty 2

## 2016-11-15 MED ORDER — LACTATED RINGERS IV SOLN: 500.0000 mL | Freq: Once | INTRAVENOUS | Status: DC

## 2016-11-15 MED ORDER — LACTATED RINGERS IV SOLN: INTRAVENOUS | Status: DC

## 2016-11-15 MED ORDER — OXYTOCIN BOLUS FROM INFUSION: 500.0000 mL | Freq: Once | INTRAVENOUS | Status: DC

## 2016-11-15 MED ORDER — IBUPROFEN 600 MG PO TABS
600.0000 mg | ORAL_TABLET | Freq: Four times a day (QID) | ORAL | Status: DC
  Administered 2016-11-15 – 2016-11-17 (×7): 600 mg via ORAL
  Filled 2016-11-15 (×7): qty 1

## 2016-11-15 MED ORDER — DIPHENHYDRAMINE HCL 25 MG PO CAPS: 25.0000 mg | ORAL_CAPSULE | Freq: Four times a day (QID) | ORAL | Status: DC | PRN

## 2016-11-15 MED ORDER — LIDOCAINE HCL (PF) 1 % IJ SOLN
INTRAMUSCULAR | Status: DC | PRN
Start: 2016-11-15 — End: 2016-11-15
  Administered 2016-11-15 (×2): 6 mL via EPIDURAL

## 2016-11-15 MED ORDER — MISOPROSTOL 25 MCG QUARTER TABLET: 25.0000 ug | ORAL_TABLET | ORAL | Status: DC | PRN

## 2016-11-15 MED ORDER — LACTATED RINGERS IV SOLN
INTRAVENOUS | Status: DC
  Administered 2016-11-15 (×2): via INTRAVENOUS

## 2016-11-15 NOTE — Anesthesia Preprocedure Evaluation (Addendum)
Anesthesia Evaluation  Patient identified by MRN, date of birth, ID band Patient awake    Reviewed: Allergy & Precautions, NPO status , Patient's Chart, lab work & pertinent test results  History of Anesthesia Complications Negative for: history of anesthetic complications  Airway Mallampati: III  TM Distance: >3 FB Neck ROM: Full    Dental  (+) Dental Advisory Given   Pulmonary former smoker,    breath sounds clear to auscultation       Cardiovascular negative cardio ROS   Rhythm:Regular Rate:Normal     Neuro/Psych negative neurological ROS  negative psych ROS   GI/Hepatic GERD  ,(+)     substance abuse  marijuana use,   Endo/Other  diabetes (glu 112)  Renal/GU      Musculoskeletal   Abdominal   Peds  Hematology plt 255k   Anesthesia Other Findings   Reproductive/Obstetrics (+) Pregnancy                            Anesthesia Physical Anesthesia Plan  ASA: III  Anesthesia Plan: Epidural   Post-op Pain Management:    Induction:   PONV Risk Score and Plan: Treatment may vary due to age or medical condition  Airway Management Planned: Natural Airway  Additional Equipment:   Intra-op Plan:   Post-operative Plan:   Informed Consent: I have reviewed the patients History and Physical, chart, labs and discussed the procedure including the risks, benefits and alternatives for the proposed anesthesia with the patient or authorized representative who has indicated his/her understanding and acceptance.   Dental advisory given  Plan Discussed with:   Anesthesia Plan Comments: (Patient identified. Risks/Benefits/Options discussed with patient including but not limited to bleeding, infection, nerve damage, paralysis, failed block, incomplete pain control, headache, blood pressure changes, nausea, vomiting, reactions to medication both or allergic, itching and postpartum back pain.  Confirmed with bedside nurse the patient's most recent platelet count. Confirmed with patient that they are not currently taking any anticoagulation, have any bleeding history or any family history of bleeding disorders. Patient expressed understanding and wished to proceed. All questions were answered. )       Anesthesia Quick Evaluation

## 2016-11-15 NOTE — Progress Notes (Signed)
Labor Progress Note Kameka BRINLEY ROSETE is a 26 y.o. G1P0 at [redacted]w[redacted]d presented for latent labor and non reactive NST. Pregnancy complicated by A1GDM. S: Patient is comfortable with epidural  O:  BP 115/70   Pulse 75   Temp 98.7 F (37.1 C) (Oral)   Resp 18   Ht 5' (1.524 m)   Wt 149 lb (67.6 kg)   LMP 02/10/2016   SpO2 100%   BMI 29.10 kg/m  EFM: 150/min var/no acels/ no decels  CVE: Dilation: 7 Effacement (%): 100 Cervical Position: Middle Station: -1 Presentation: Vertex Exam by:: stone rnc   A&P: 26 y.o. G1P0 [redacted]w[redacted]d presented for latent labor at term, non reactive NST. Pregnancy complicated by A1GDM. #Labor: Progressing well. Continue pitocin. Anticipate SVD. #Pain: epidural #FWB: cat 2 #GBS negative #A1GDM- most recent blood glucose was 99  Shaunte Weissinger, DO 9:19 AM

## 2016-11-15 NOTE — Anesthesia Procedure Notes (Signed)
Epidural Patient location during procedure: OB Start time: 11/15/2016 2:12 AM End time: 11/15/2016 2:32 AM  Staffing Anesthesiologist: Jairo Ben Performed: anesthesiologist   Preanesthetic Checklist Completed: patient identified, surgical consent, pre-op evaluation, timeout performed, IV checked, risks and benefits discussed and monitors and equipment checked  Epidural Patient position: sitting Prep: site prepped and draped and DuraPrep Patient monitoring: blood pressure, continuous pulse ox and heart rate Approach: midline Location: L3-L4 Injection technique: LOR air  Needle:  Needle type: Tuohy  Needle gauge: 17 G Needle length: 9 cm Needle insertion depth: 4.5 cm Catheter type: closed end flexible Catheter size: 19 Gauge Catheter at skin depth: 10 cm Test dose: negative (1% Lidocaine)  Assessment Events: blood not aspirated, injection not painful, no injection resistance, negative IV test and no paresthesia  Additional Notes Pt identified in Labor room.  Monitors applied. Working IV access confirmed. Sterile prep, drape lumbar spine.  1% lido local L 3,4.  #17ga Touhy LOR air at 4.5 cm L 3,4, cath in easily to 10 cm skin. Test dose OK, cath dosed and infusion begun.  Patient asymptomatic, VSS, no heme aspirated, tolerated well.  Sandford Craze, MD Reason for block:procedure for pain

## 2016-11-15 NOTE — Anesthesia Pain Management Evaluation Note (Signed)
  CRNA Pain Management Visit Note  Patient: Erin Matthews, 26 y.o., female  "Hello I am a member of the anesthesia team at [facility]. We have an anesthesia team available at all times to provide care throughout the hospital, including epidural management and anesthesia for C-section. I don't know your plan for the delivery whether it a natural birth, water birth, IV sedation, nitrous supplementation, doula or epidural, but we want to meet your pain goals."   1.Was your pain managed to your expectations on prior hospitalizations?   No prior hospitalizations  2.What is your expectation for pain management during this hospitalization?     Epidural  3.How can we help you reach that goal?epidural  Record the patient's initial score and the patient's pain goal.   Pain: 0  Pain Goal: 0 [facility] wants you to be able to say your pain was always managed very well.  Marney Treloar 11/15/2016

## 2016-11-15 NOTE — Progress Notes (Signed)
AROM at 1025AM, clear fluid. Continue IV Pit  Julie P. Degele, MD OB Fellow

## 2016-11-15 NOTE — H&P (Signed)
Obstetric History and Physical  Erin Matthews is a 26 y.o. G1P0 with IUP at [redacted]w[redacted]d presenting for latent labor. Prenatal history complicated by A1GDM. Patient states she has been having  regular, every 5 minutes contractions, none vaginal bleeding, intact membranes, with active fetal movement.  No blurry vision, headaches or peripheral edema, and RUQ pain.   Prenatal Course Source of Care: [place]  Dating: By LMP --->  Estimated Date of Delivery: 11/16/16 Pregnancy complications or risks: Patient Active Problem List   Diagnosis Date Noted  . GDM (gestational diabetes mellitus) 08/12/2016  . Supervision of high risk pregnancy, antepartum 04/27/2016  . Marijuana use 08/06/2013  . Low vitamin D level 08/02/2013   She plans to breastfeed She desires undecided for postpartum contraception.   Sono:    , CWD, normal anatomy, cephalic presentation, posterior placenta, 2911g, 48% EFW  Prenatal labs and studies: ABO, Rh: O/Positive/-- (03/13 1555) Antibody: Negative (03/13 1555) Rubella: 1.60 (03/13 1555) RPR: Non Reactive (06/26 1038)  HBsAg: Negative (03/13 1555)  HIV:   non-reactive ZOX:[id number] (09/06 1548) 2 hr Glucola  98/165/145 (abnormal) Genetic screening normal Anatomy US normal  Prenatal Transfer Tool  Maternal Diabetes: Yes:  Diabetes Type:  Diet controlled Genetic Screening: Normal Maternal Ultrasounds/Referrals: Normal Fetal Ultrasounds or other Referrals:  None Maternal Substance Abuse:  No Significant Maternal Medications:  None Significant Maternal Lab Results: Lab values include: Group B Strep negative  Past Medical History:  Diagnosis Date  . Gestational diabetes   . Marijuana use   . Nicotine dependence 08/02/2013  . Tibial plateau fracture, left 08/02/2013  . Unspecified vitamin D deficiency 08/02/2013    Past Surgical History:  Procedure Laterality Date  . FASCIOTOMY Left 08/03/2013   Procedure: ANTERIOR COMPARTMENT FASCIOTOMY;  Surgeon: Budd Palmer, MD;  Location: [facility] OR;  Service: Orthopedics;  Laterality: Left;  . MENISCUS REPAIR Left 08/03/2013   Procedure: REPAIR OF LATERAL MENISCUS;  Surgeon: Budd Palmer, MD;  Location: [facility] OR;  Service: Orthopedics;  Laterality: Left;  . ORIF TIBIA PLATEAU Left 08/03/2013   Procedure: OPEN REDUCTION INTERNAL FIXATION (ORIF) TIBIAL PLATEAU WITH REPAIR OF TIBIAL EMMINENCE;  Surgeon: Budd Palmer, MD;  Location: [facility] OR;  Service: Orthopedics;  Laterality: Left;    OB History  Gravida Para Term Preterm AB Living  1            SAB TAB Ectopic Multiple Live Births               # Outcome Date GA Lbr Len/2nd Weight Sex Delivery Anes PTL Lv  1 Current               Social History   Social History  . Marital status: Single    Spouse name: N/A  . Number of children: N/A  . Years of education: N/A   Social History Main Topics  . Smoking status: Former Smoker    Packs/day: 0.75    Types: Cigarettes    Quit date: 03/23/2016  . Smokeless tobacco: Never Used  . Alcohol use No     Comment: social   . Drug use: No     Comment: denies illicity drug use   . Sexual activity: Not Asked   Other Topics Concern  . 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 Diabetes Paternal Uncle  Prescriptions Prior to Admission  Medication Sig Dispense Refill Last Dose  . acetaminophen (TYLENOL) 325 MG tablet Take 650 mg by mouth every 6 (six) hours as needed.   Past Week at Unknown time  . ondansetron (ZOFRAN) 8 MG tablet Take 1 tablet (8 mg total) by mouth every 8 (eight) hours as needed for nausea or vomiting. 40 tablet 2 Past Week at Unknown time  . Prenatal Vit-Fe Phos-FA-Omega (VITAFOL GUMMIES) 3.33-0.333-34.8 MG CHEW Chew 3 tablets by mouth at bedtime. 90 tablet 12 Past Month at Unknown time  . Vitamin D, Ergocalciferol, (DRISDOL) 50000 units CAPS capsule Take 1 capsule (50,000 Units total)  by mouth every 7 (seven) days. 8 capsule 5 Past Week at Unknown time  . ACCU-CHEK FASTCLIX LANCETS MISC 1 application by Percutaneous route 4 (four) times daily. 100 each 12 Taking  . Doxylamine-Pyridoxine (DICLEGIS) 10-10 MG TBEC 1 tab in AM, 1 tab mid afternoon 2 tabs at bedtime. Max dose 4 tabs daily. 100 tablet 5 More than a month at Unknown time  . glucose blood test strip Check glucose levels fasting am and 2 hours after meals 100 each 12 Taking    No Known Allergies  Review of Systems: Negative except for what is mentioned in HPI.  Physical Exam: BP 126/61 (BP Location: Right Arm)   Pulse 79   Temp (!) 97.4 F (36.3 C) (Oral)   Resp 18   Ht 5' (1.524 m)   Wt 67.6 kg (149 lb)   LMP 02/10/2016   SpO2 100%   BMI 29.10 kg/m  CONSTITUTIONAL: Well-developed, well-nourished female in no acute distress.  HENT:  Normocephalic, atraumatic, External right and left ear normal. Oropharynx is clear and moist EYES: Conjunctivae and EOM are normal. Pupils are equal, round, and reactive to light. No scleral icterus.  NECK: Normal range of motion, supple, no masses SKIN: Skin is warm and dry. No rash noted. Not diaphoretic. No erythema. No pallor. NEUROLOGIC: Alert and oriented to person, place, and time. Normal reflexes, muscle tone coordination. No cranial nerve deficit noted. PSYCHIATRIC: Normal mood and affect. Normal behavior. Normal judgment and thought content. CARDIOVASCULAR: Normal heart rate noted, regular rhythm RESPIRATORY: Effort and breath sounds normal, no problems with respiration noted ABDOMEN: Soft, nontender, nondistended, gravid. MUSCULOSKELETAL: Normal range of motion. No edema and no tenderness. 2+ distal pulses.  Dilation: 4 Effacement (%): 90 Cervical Position: Middle Station: -2 Presentation: Vertex Exam by:: B Mosca RN Presentation: cephalic FHT:  Baseline rate 130 bpm   Variability moderate  Accelerations present   Decelerations none Contractions: Every 2-5  mins   Pertinent Labs/Studies:   Results for orders placed or performed during the hospital encounter of 11/14/16 (from the past 24 hour(s))  Glucose, capillary     Status: Abnormal   Collection Time: 11/15/16  1:10 AM  Result Value Ref Range   Glucose-Capillary 100 (H) 65 - 99 mg/dL   Comment 1 Notify RN   CBC     Status: Abnormal   Collection Time: 11/15/16  1:10 AM  Result Value Ref Range   WBC 9.4 4.0 - 10.5 K/uL   RBC 3.82 (L) 3.87 - 5.11 MIL/uL   Hemoglobin 12.6 12.0 - 15.0 g/dL   HCT 16.1 09.6 - 04.5 %   MCV 94.5 78.0 - 100.0 fL   MCH 33.0 26.0 - 34.0 pg   MCHC 34.9 30.0 - 36.0 g/dL   RDW 40.9 81.1 - 91.4 %   Platelets 255 150 - 400 K/uL  Assessment : Anisah JENAE TOMASELLO is a 26 y.o. G1P0 at [redacted]w[redacted]d being admitted for latent labor. A1GDM. Had NR-NST in MAU.  Plan: Labor: Expectant management. Augmentation as ordered as per protocol. Epidural order placed for pain management. GDM: CBG on admission 100; diet-controlled GDM FWB: Reassuring fetal heart tracing.  GBS negative Delivery plan: Hopeful for vaginal delivery   Caryl Ada, DO OB Fellow Faculty Practice, [facility] [place] 11/15/2016, 12:41 AM

## 2016-11-15 NOTE — Anesthesia Postprocedure Evaluation (Signed)
Anesthesia Post Note  Patient: Erin Matthews  Procedure(s) Performed: AN AD HOC LABOR EPIDURAL     Patient location during evaluation: Mother Baby Anesthesia Type: Epidural Level of consciousness: awake Pain management: satisfactory to patient Vital Signs Assessment: post-procedure vital signs reviewed and stable Respiratory status: spontaneous breathing Cardiovascular status: stable Anesthetic complications: no    Last Vitals:  Vitals:   11/15/16 1615 11/15/16 1715  BP: 140/73 126/72  Pulse: 78 77  Resp: 16 18  Temp: 36.4 C 36.8 C  SpO2: 100% 100%    Last Pain:  Vitals:   11/15/16 1715  TempSrc: Oral  PainSc:    Pain Goal:                 [employer]

## 2016-11-15 NOTE — Progress Notes (Signed)
Recheck in one hour. Pt may walk after reactive tracng

## 2016-11-15 NOTE — Lactation Note (Addendum)
This note was copied from a baby's chart. Lactation Consultation Note  Patient Name: Erin Matthews Today's Date: 11/15/2016 Reason for consult: Initial assessment;NICU baby  Initial visit at 7 hours of life. Infant is currently in the NICU. Mom reports + breast changes w/pregnancy.   Mom was shown how to use DEBP & how to disassemble, clean, & reassemble parts. Mom knows to pump q3h for 15 min on the preemie setting. Mom was able to obtain 2 ml of colostrum with her 1st pumping session. Mom was also shown how to do hand expression, but Mom prefers the DEBP. Mom was shown how to assemble & use hand pump that was included in pump kit. Colostrum stickers provided along w/NICU booklet. RN given update.   Mom plans to go to sleep after she finishes her dinner & encouraged to pump q3hrs upon awakening. Mom is currently in room by herself. Pump parts washed & drying for next use.    Matthias Hughs [facility] 11/15/2016, 10:35 PM

## 2016-11-16 ENCOUNTER — Inpatient Hospital Stay (HOSPITAL_COMMUNITY): Admission: RE | Admit: 2016-11-16 | Payer: Medicaid Other | Source: Ambulatory Visit | Admitting: Family Medicine

## 2016-11-16 LAB — GLUCOSE, CAPILLARY: Glucose-Capillary: 85 mg/dL (ref 65–99)

## 2016-11-16 NOTE — Plan of Care (Signed)
Problem: Bowel/Gastric: Goal: Gastrointestinal status will improve Outcome: Completed/Met Date Met: 11/16/16 Patient stated that she has had a bowel movement.

## 2016-11-16 NOTE — Plan of Care (Signed)
Problem: Activity: Goal: Will verbalize the importance of balancing activity with adequate rest periods Outcome: Completed/Met Date Met: 11/16/16 Patient ambulating to NICU. Rest periods encouraged.

## 2016-11-16 NOTE — Progress Notes (Signed)
Post Partum Day 1 Subjective: no complaints, up ad lib, voiding and tolerating PO  Objective: Blood pressure 117/61, pulse 73, temperature 99 F (37.2 C), temperature source Oral, resp. rate 18, height 5' (1.524 m), weight 149 lb (67.6 kg), last menstrual period 02/10/2016, SpO2 100 %, unknown if currently breastfeeding.  Physical Exam:  General: alert, cooperative and no distress Lochia: appropriate Uterine Fundus: firm Incision: healing well, no significant drainage DVT Evaluation: No evidence of DVT seen on physical exam.   Recent Labs  11/15/16 0110  HGB 12.6  HCT 36.1    Assessment/Plan: Plan for discharge tomorrow, Breastfeeding, Lactation consult and Contraception still undecided at this time Infant in NICU at this time; anticipate baby and mom to go home tomorrow.    LOS: 1 day   Arlyce Harman 11/16/2016, 11:35 AM   I confirm that I have verified the information documented in the resident's note and that I have also personally reperformed the physical exam and all medical decision making activities.  Luna Kitchens

## 2016-11-16 NOTE — Lactation Note (Signed)
This note was copied from a baby's chart. Lactation Consultation Note  Patient Name: Girl Bob Eastwood [id number] Date: 11/16/2016 Reason for consult: Follow-up assessment Baby at 24 hr of life. Mom stated she is pumping "almost every 3 hr". She stated she is getting drops when she pumps. Discussed pumping frequency, hands on pumping/manual expression after pumping, baby belly size, breast changes, and nipple care. Mom is aware of lactation services and support group.    Maternal Data    Feeding Feeding Type: Breast Milk with Formula added Length of feed: 30 min  LATCH Score                   Interventions    Lactation Tools Discussed/Used     Consult Status Consult Status: Follow-up Date: 11/17/16 Follow-up type: In-patient    Rulon Eisenmenger 11/16/2016, 2:48 PM

## 2016-11-17 MED ORDER — IBUPROFEN 600 MG PO TABS: 600.0000 mg | ORAL_TABLET | Freq: Four times a day (QID) | ORAL | 0 refills | Status: DC

## 2016-11-17 MED ORDER — SENNOSIDES-DOCUSATE SODIUM 8.6-50 MG PO TABS: 2.0000 | ORAL_TABLET | Freq: Every evening | ORAL | 0 refills | Status: DC | PRN

## 2016-11-17 NOTE — Lactation Note (Signed)
This note was copied from a baby's chart. Lactation Consultation Note  Patient Name: Erin Matthews Date: 11/17/2016 Reason for consult: Follow-up assessment   Baby 45 hours old and baby in NICU. Mother states she has been pumping w/ DEBP q 3 hours. We is being discharged and wanted to know about a DEBP from [facility]. Contacted WIC representative who took mother's phone number and WIC rep states they will call her. Provided mother w/ manual pump.  Encouraged her to continuing hand expressing before pumping. Reviewed engorgement care and suggest mother call if she needs further assistance.    Maternal Data    Feeding Feeding Type: Formula Nipple Type: Slow - flow Length of feed: 20 min  LATCH Score                   Interventions    Lactation Tools Discussed/Used     Consult Status Consult Status: Complete    Hardie Pulley 11/17/2016, 11:57 AM

## 2016-11-17 NOTE — Discharge Summary (Signed)
OB Discharge Summary     Patient Name: Erin Matthews DOB: [birth year] MRN: [id number]  Date of admission: 11/14/2016 Delivering MD: Frederik Pear   Date of discharge: 11/17/2016  Admitting diagnosis: 39wks ctx 5 mins  Intrauterine pregnancy: [redacted]w[redacted]d     Secondary diagnosis:  Principal Problem:   SVD (spontaneous vaginal delivery) Active Problems:   Non-reactive NST (non-stress test)   GDM, class A1  Additional problems: +THC use     Discharge diagnosis: Term Pregnancy Delivered                                                                                                Post partum procedures:none  Augmentation: AROM and Pitocin  Complications: None  Hospital course:  Onset of Labor With Vaginal Delivery     26 y.o. yo G1P1001 at [redacted]w[redacted]d was admitted in Latent Labor on 11/14/2016. Patient had an uncomplicated labor course as follows:  Membrane Rupture Time/Date: 10:25 AM ,11/15/2016   Intrapartum Procedures: Episiotomy: None [1]                                         Lacerations:  None [1]  Patient had a delivery of a Viable infant. 11/15/2016  Information for the patient's newborn:  Vinessa, Macconnell [[id number]]  Delivery Method: Vag-Spont    Pateint had an uncomplicated postpartum course.  She is ambulating, tolerating a regular diet, passing flatus, and urinating well. Fasting CBG postpartum was 85. Patient is discharged home in stable condition on 11/18/16.   Physical exam  Vitals:   11/15/16 1715 11/15/16 2120 11/16/16 0617 11/16/16 1732  BP: 126/72 112/67 117/61 (!) 116/44  Pulse: 77 72 73 79  Resp: Temp: 98.3 F (36.8 C) 99 F (37.2 C) 99 F (37.2 C) 98.6 F (37 C)  TempSrc: Oral Oral Oral   SpO2: 100%     Weight:      Height:       General: alert, cooperative and no distress Lochia: appropriate Uterine Fundus: firm Incision: N/A DVT Evaluation: No evidence of DVT seen on physical exam. Labs: Lab Results  Component Value  Date   WBC 9.4 11/15/2016   HGB 12.6 11/15/2016   HCT 36.1 11/15/2016   MCV 94.5 11/15/2016   PLT 255 11/15/2016   CMP Latest Ref Rng & Units 03/29/2016  Glucose 65 - 99 mg/dL 92  BUN 6 - 20 mg/dL 6  Creatinine 7.82 - 9.56 mg/dL 2.13  Sodium 086 - 578 mmol/L 137  Potassium 3.5 - 5.1 mmol/L 3.8  Chloride 101 - 111 mmol/L 104  CO2 22 - 32 mmol/L 26  Calcium 8.9 - 10.3 mg/dL 9.2  Total Protein 6.5 - 8.1 g/dL -  Total Bilirubin 0.3 - 1.2 mg/dL -  Alkaline Phos 38 - 469 U/L -  AST 15 - 41 U/L -  ALT 14 - 54 U/L -    Discharge instruction: per After Visit Summary and "Baby and Me Booklet".  After visit meds:  Allergies as of 11/17/2016   No Known Allergies     Medication List    STOP taking these medications   Doxylamine-Pyridoxine 10-10 MG Tbec Commonly known as:  DICLEGIS     TAKE these medications   ACCU-CHEK FASTCLIX LANCETS Misc 1 application by Percutaneous route 4 (four) times daily.   acetaminophen 325 MG tablet Commonly known as:  TYLENOL Take 650 mg by mouth every 6 (six) hours as needed.   glucose blood test strip Check glucose levels fasting am and 2 hours after meals   ibuprofen 600 MG tablet Commonly known as:  ADVIL,MOTRIN Take 1 tablet (600 mg total) by mouth every 6 (six) hours.   ondansetron 8 MG tablet Commonly known as:  ZOFRAN Take 1 tablet (8 mg total) by mouth every 8 (eight) hours as needed for nausea or vomiting.   senna-docusate 8.6-50 MG tablet Commonly known as:  Senokot-S Take 2 tablets by mouth at bedtime as needed for mild constipation.   VITAFOL GUMMIES 3.33-0.333-34.8 MG Chew Chew 3 tablets by mouth at bedtime.   Vitamin D (Ergocalciferol) 50000 units Caps capsule Commonly known as:  DRISDOL Take 1 capsule (50,000 Units total) by mouth every 7 (seven) days.       Diet: routine diet  Activity: Advance as tolerated. Pelvic rest for 6 weeks.   Outpatient follow up:6 weeks Follow up Appt:Future Appointments Date Time  Provider Department Center  12/27/2016 8:15 AM [facility] None  12/27/2016 8:45 AM Constant, Gigi Gin, MD [facility] None   Follow up Visit:No Follow-up on file.  Postpartum contraception: Undecided  Newborn Data: Live born female  Birth Weight: 6 lb 7.2 oz (2925 g) APGAR: 8, 9  Newborn Delivery   Birth date/time:  11/15/2016 14:36:00 Delivery type:  Vaginal, Spontaneous Delivery      Baby Feeding: Breast Disposition:NICU    11/17/2016 Caryl Ada, DO

## 2016-11-17 NOTE — Progress Notes (Signed)
Post Partum Day 2 Subjective: Erin Matthews is a 26 y/o G1P1 with history of A1GDM s/p SVD. Pregnancy complicated by A1GDM. Patient doing well this morning. Only complaint is mild dysuria after birth which has resolved. Abdominal pain well controlled on motrin. Patient up ad lib. Minimal vaginal bleeding. Baby is in NICU and doing well per mom. Patient plans on breast feeding and is undecided on contraception. She would like to go home today.   Objective: Blood pressure 108/69, pulse 74, temperature 97.6 F (36.4 C), temperature source Oral, resp. rate 18, height 5' (1.524 m), weight 67.6 kg (149 lb), last menstrual period 02/10/2016, SpO2 100 %, unknown if currently breastfeeding.  Physical Exam:  General: alert, cooperative, appears stated age and no distress Lochia: appropriate Uterine Fundus: soft Abdomen: soft, nontender to palpation, no guarding or rebound  Incision: N/A DVT Evaluation: No evidence of DVT seen on physical exam. Negative Homan's sign.   Recent Labs  11/15/16 0110  HGB 12.6  HCT 36.1    Assessment/Plan: Discharge home, Breastfeeding and Contraception undecided    LOS: 2 days   Dyke Maes Neveah Bang. PA-S 11/17/2016, 9:00 AM

## 2016-11-17 NOTE — Discharge Instructions (Signed)

## 2016-11-17 NOTE — Progress Notes (Signed)
CSW acknowledges consult and completed clinical assessment.  Clinical documentation will follow. CSW will make a [place] CPS for infant's positive UDS for THC.  There are no barriers to d/c.  Sher Hellinger Boyd-Gilyard, MSW, LCSW Clinical Social Work [phone]   

## 2016-11-18 ENCOUNTER — Encounter: Payer: Medicaid Other | Admitting: Obstetrics and Gynecology

## 2016-11-18 NOTE — Clinical Social Work Maternal (Signed)
CLINICAL SOCIAL WORK MATERNAL/CHILD NOTE  Patient Details  Name: Erin Matthews MRN: [id number] Date of Birth: 02/08/1991  Date:  11/18/2016  Clinical Social Worker Initiating Note:  Sierra Bissonette Boyd-Gilyard          Date/Time: Initiated:  11/17/16/1234             Child's Name:  Erin Matthews   Biological Parents:  Mother, Father   Need for Interpreter:  None   Reason for Referral:  Current Substance Use/Substance Use During Pregnancy    Address:  [address] [place] [address]    Phone number:  [phone] (home)     Additional phone number: FOB's telephone number is [phone]  Household Members/Support Persons (HM/SP):   Household Member/Support Person 1   HM/SP Name Relationship DOB or Age  HM/SP -1 James Austin  FOB 04/15/79  HM/SP -2     HM/SP -3     HM/SP -4     HM/SP -5     HM/SP -6     HM/SP -7     HM/SP -8       Natural Supports (not living in the home): Immediate Family, Friends, Parent, Extended Family (FOB's family will also provide support to the family. )   Professional Supports:None   Employment:Full-time   Type of Work: [employer]   Education:  High school graduate   Homebound arranged:    Financial Resources:Medicaid   Other Resources: WIC, Food Stamps    Cultural/Religious Considerations Which May Impact Care: Per MOB's Face Sheet, MOB is Non-Denominational  Strengths: Ability to meet basic needs , Home prepared for child , Pediatrician chosen   Psychotropic Medications:         Pediatrician:       Pediatrician List:   [place]   [place]   [place]   [place]   [place]   [place]     Pediatrician Fax Number:    Risk Factors/Current Problems: Substance Use    Cognitive State: Alert , Able to Concentrate , Insightful , Linear Thinking    Mood/Affect: Happy , Bright , Interested , Comfortable    CSW Assessment:CSW met with MOB  to complete an assessment for SA.  When CSW arrived, MOB was pack MOB's items preparing for discharge.  MOB was polite, forthcoming, and receptive to meeting with CSW.   CSW inquired about MOB substance use and MOB reported smoking marijuana "Towards the end of my pregnancy to help increase my appetite."  Per MOB, MOB last smoked about one week ago.  CSW informed MOB of the hospital's drug screen policy. CSW was made aware of the 2 drug screenings for the infant.  MOB was understanding.  CSW shared with MOB that the infant had a positive UDS, and CSW will make a report to [place] CPS.  CSW also made MOB aware that CSW will monitor the infant's CDS and will make a report results to [place] CPS. CSW offered MOB resources and referrals for substance interventions and MOB declined.  MOB did not have any questions about the hospital's policy but had several questions regarding CPS investigation;CSW informed MOB about CPS investigation process. MOB reports having all necessary items for infant and feeling prepared to parent.   CSW provided SIDS and Safe Sleep education.   CSW thanked MOB for meeting with CSW and provided MOB with CSW's contact information.  A CPS report was made on 11/18/16, to [place] CPS, Pam Miller.     CSW Plan/Description: Perinatal Mood and Anxiety Disorder (PMADs) Education, Other Patient/Family Education, Hospital Drug Screen Policy Information, Child Protective Service Report , Other Information/Referral to Community Resources, Sudden Infant Death Syndrome (SIDS) Education, No Further Intervention Required/No Barriers to Discharge   Forrest Jaroszewski Boyd-Gilyard, MSW, LCSW Clinical Social Work [phone]   Terald Jump D BOYD-GILYARD, LCSW 11/18/2016, 8:56 AM   

## 2016-12-27 ENCOUNTER — Encounter: Payer: Self-pay | Admitting: Obstetrics and Gynecology

## 2016-12-27 ENCOUNTER — Other Ambulatory Visit: Payer: Medicaid Other

## 2016-12-27 ENCOUNTER — Ambulatory Visit (INDEPENDENT_AMBULATORY_CARE_PROVIDER_SITE_OTHER): Payer: Medicaid Other | Admitting: Obstetrics and Gynecology

## 2016-12-27 DIAGNOSIS — Z1389 Encounter for screening for other disorder: Secondary | ICD-10-CM | POA: Diagnosis not present

## 2016-12-27 DIAGNOSIS — O0990 Supervision of high risk pregnancy, unspecified, unspecified trimester: Secondary | ICD-10-CM

## 2016-12-27 MED ORDER — NORGESTIMATE-ETH ESTRADIOL 0.25-35 MG-MCG PO TABS
1.0000 | ORAL_TABLET | Freq: Every day | ORAL | 11 refills | Status: DC
Start: 2016-12-27 — End: 2020-03-13

## 2016-12-27 NOTE — Progress Notes (Deleted)
Post Partum Exam  Erin Matthews is a 26 y.o. 621P1001 female who presents for a postpartum visit. She is 6 weeks postpartum following a spontaneous vaginal delivery. I have fully reviewed the prenatal and intrapartum course. The delivery was at 3860w6d gestational weeks.  Anesthesia: epidural. Postpartum course has been good. Baby's course has been great. Baby is feeding by bottle - [employer] Good Start. Bleeding red. Bowel function is normal. Bladder function is normal. Patient is not sexually active. Contraception method requested is OCP (estrogen/progesterone). Postpartum depression screening:  0  {Common ambulatory SmartLinks:19316}  Review of Systems {ros; complete:30496}    Objective:  Blood pressure 118/78, pulse (!) 105, weight 130 lb (59 kg), unknown if currently breastfeeding.  General:  {gen appearance:16600}   Breasts:  {breast exam:1202::"inspection negative, no nipple discharge or bleeding, no masses or nodularity palpable"}  Lungs: {lung exam:16931}  Heart:  {heart exam:5510}  Abdomen: {abdomen exam:16834}   Vulva:  {labia exam:12198}  Vagina: {vagina exam:12200}  Cervix:  {cervix exam:14595}  Corpus: {uterus exam:12215}  Adnexa:  {adnexa exam:12223}  Rectal Exam: {rectal/vaginal exam:12274}        Assessment:    *** postpartum exam. Pap smear {done:10129} at today's visit.   Plan:   1. Contraception: {method:5051} 2. *** 3. Follow up in: {1-10:13787} {time; units:19136} or as needed.

## 2016-12-27 NOTE — Progress Notes (Signed)
Subjective:     Erin Matthews is a 26 y.o. female who presents for a postpartum visit. She is 6 weeks postpartum following a spontaneous vaginal delivery. I have fully reviewed the prenatal and intrapartum course. The delivery was at 39.6 gestational weeks. Outcome: spontaneous vaginal delivery. Anesthesia: epidural. Postpartum course has been uncomplicated. Baby's course has been uncomplicated. Baby is feeding by bottle Rush [facility]- Ger[facility]- good start. Bleeding no bleeding. Bowel function is normal. Bladder function is normal. Patient is not sexually active. Contraception method is abstinence. Postpartum depression screening: negative.   Review of Systems Pertinent items are noted in HPI.   Objective:    BP 118/78   Pulse (!) 105   Wt 130 lb (59 kg)   BMI 25.39 kg/m   General:  alert, cooperative and no distress   Breasts:  inspection negative, no nipple discharge or bleeding, no masses or nodularity palpable  Lungs: clear to auscultation bilaterally  Heart:  regular rate and rhythm, S1, S2 normal, no murmur, click, rub or gallop  Abdomen: soft, non-tender; bowel sounds normal; no masses,  no organomegaly   Vulva:  normal  Vagina: normal vagina, no discharge, exudate, lesion, or erythema  Cervix:  multiparous appearance  Corpus: normal size, contour, position, consistency, mobility, non-tender  Adnexa:  normal adnexa and no mass, fullness, tenderness  Rectal Exam: Not performed.        Assessment:     Normal postpartum exam. Pap smear not done at today's visit.   Plan:    1. Contraception: OCP (estrogen/progesterone)- Rx Sprintec provided with 12 refills 2. Patient is medically cleared to resume all activities of daily living Patient completing 2 hour glucola today due to GDM. Patient will be contacted with results 3. Follow up in: 6 months or as needed.

## 2016-12-28 LAB — GLUCOSE TOLERANCE, 2 HOURS W/ 1HR
GLUCOSE, 1 HOUR: 106 mg/dL (ref 65–179)
GLUCOSE, 2 HOUR: 76 mg/dL (ref 65–152)
GLUCOSE, FASTING: 77 mg/dL (ref 65–91)

## 2017-02-10 ENCOUNTER — Telehealth: Payer: Self-pay | Admitting: Pediatrics

## 2017-02-10 NOTE — Telephone Encounter (Signed)
Pt left msg on nurse line for call back. I attempted to call patient back.  LM for patient to return my call.

## 2017-08-26 LAB — GLUCOSE, POCT (MANUAL RESULT ENTRY): POC Glucose: 94 mg/dl (ref 70–99)

## 2017-10-14 IMAGING — US US MFM OB FOLLOW-UP
1 series · 14 of 28 positions shown · non-contrast
Comparison: none

[Series 1: us mfm ob follow-up · 33 acquisitions, 14 frames shown]
[im 2/33]
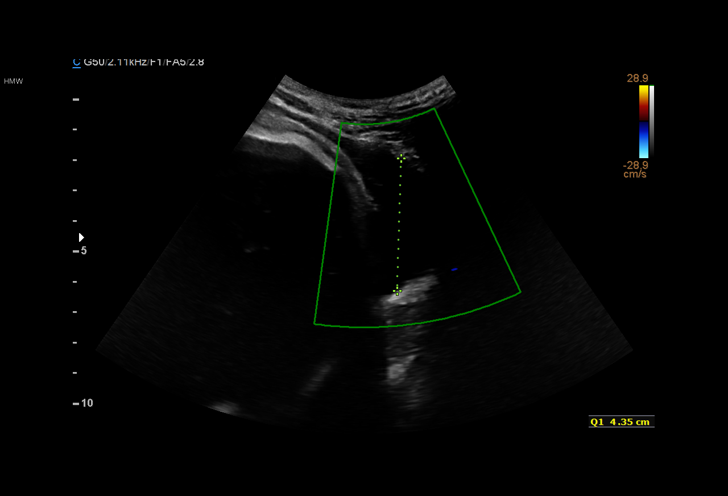
[im 4/33]
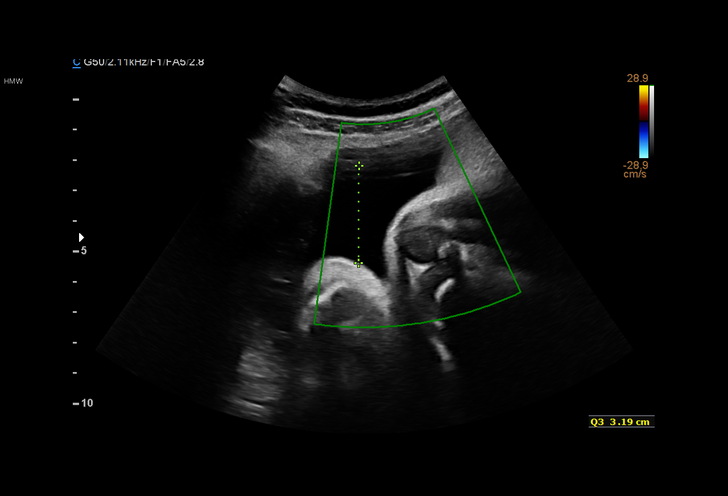
[im 6/33]
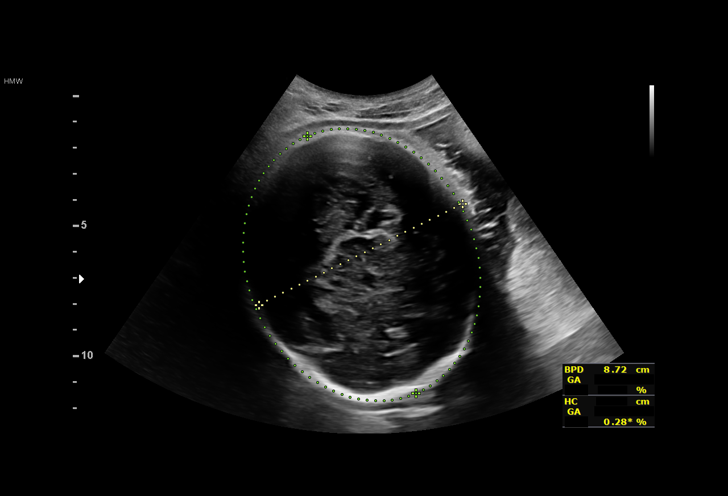
[im 9/33]
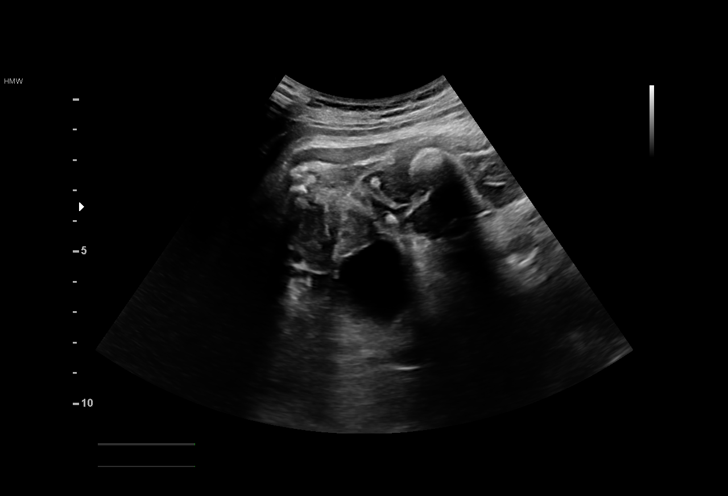
[im 11/33]
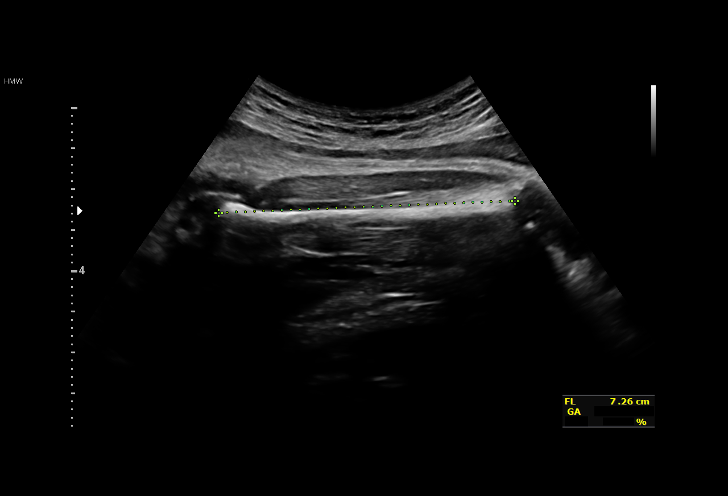
[im 14/33]
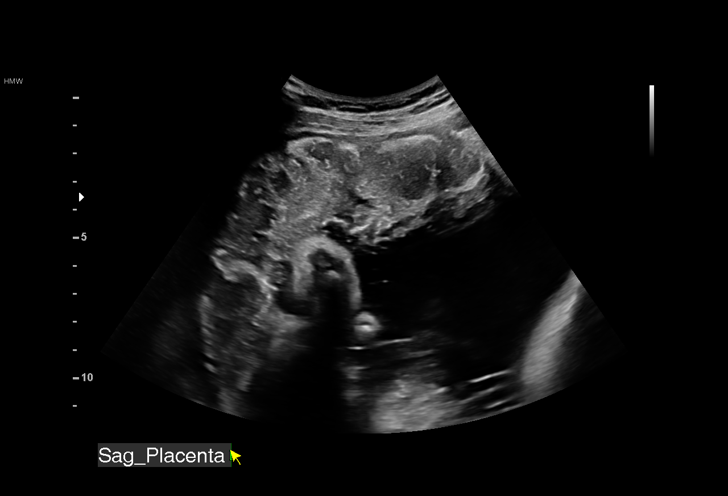
[im 16/33]
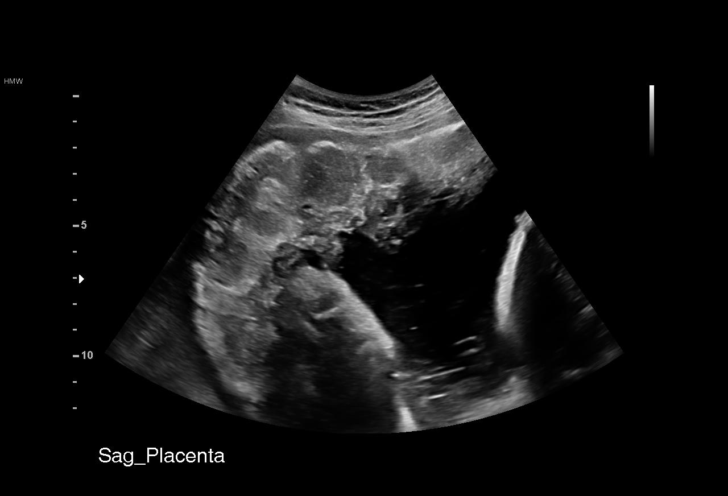
[im 18/33]
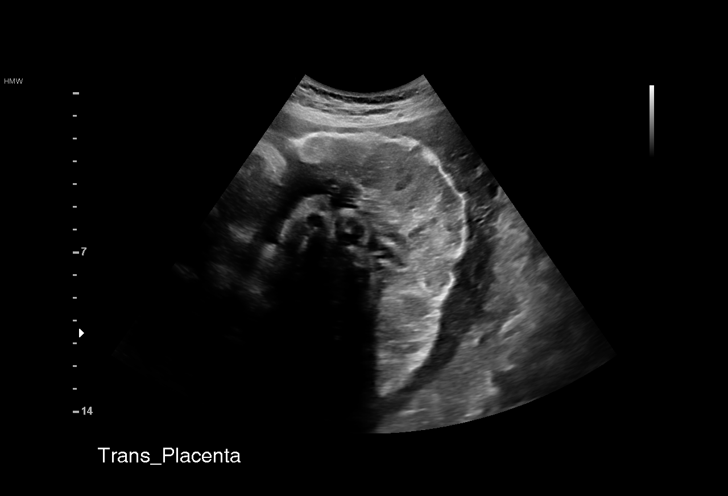
[im 21/33]
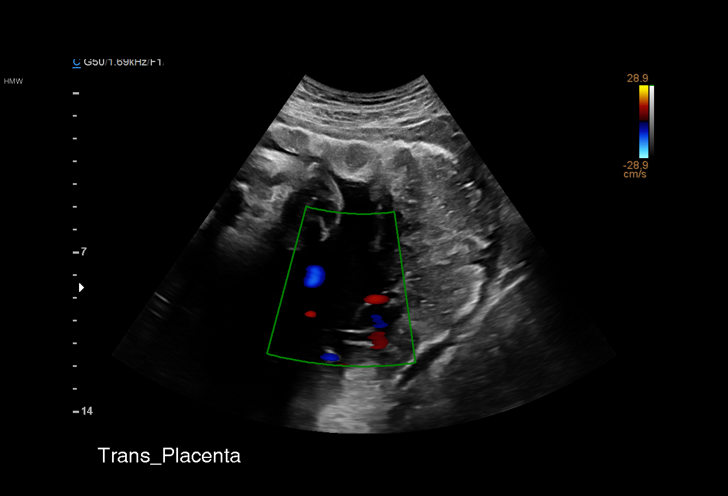
[im 23/33]
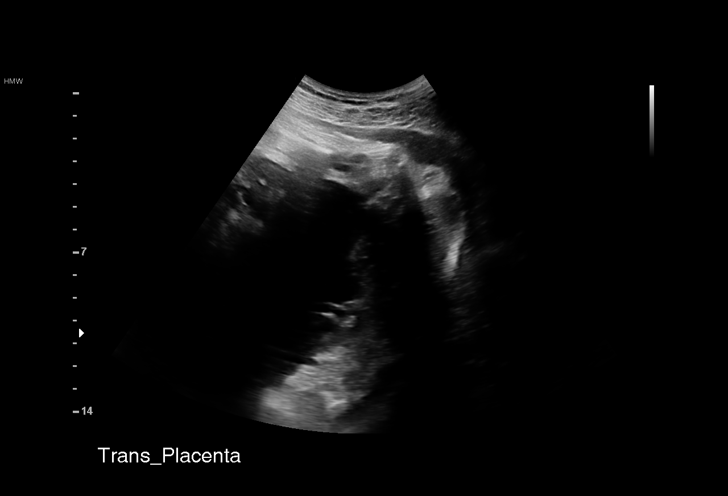
[im 25/33]
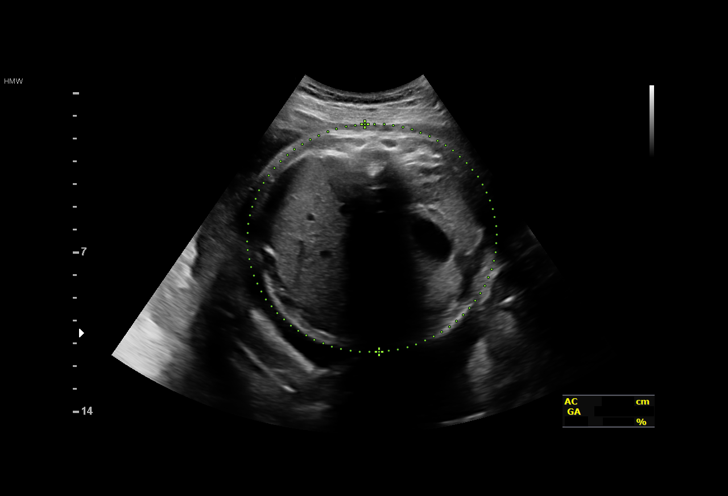
[im 28/33]
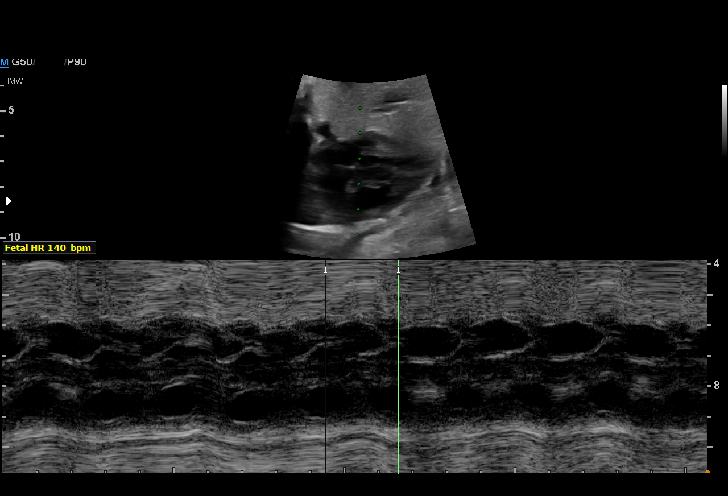
[im 30/33]
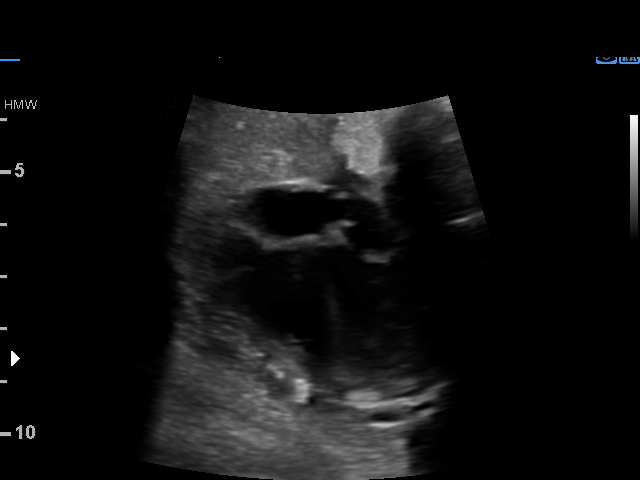
[im 33/33]
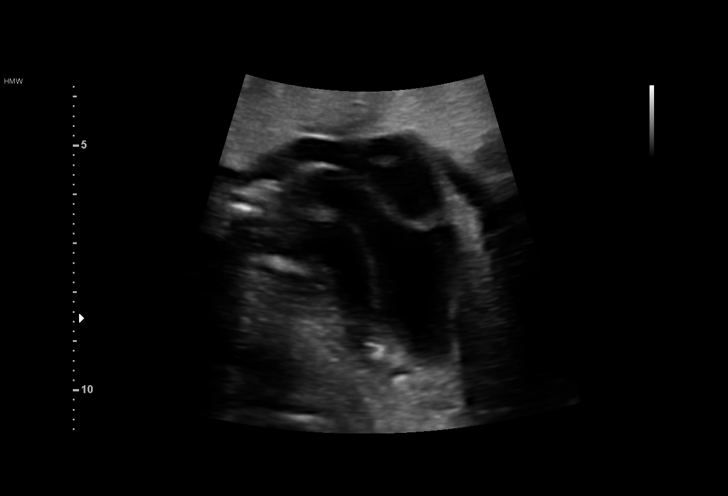

[14 of 28 positions shown; findings below may reference images not displayed]

Road [HOSPITAL]

1  [facility]            [id number]      [id number]     [id number]
Indications

37 weeks gestation of pregnancy
Gestational diabetes in pregnancy, diet
controlled
Encounter for other antenatal screening
follow-up
OB History

Gravidity:    1         Term:   0        Prem:   0        SAB:   0
TOP:          0       Ectopic:  0        Living: 0
Fetal Evaluation

Num Of Fetuses:     1
Fetal Heart         140
Rate(bpm):
Cardiac Activity:   Observed
Presentation:       Cephalic
Placenta:           Posterior, above cervical os
P. Cord Insertion:  Visualized, central

Amniotic Fluid
AFI FV:      Subjectively within normal limits

AFI Sum(cm)     %Tile       Largest Pocket(cm)
12.86           46

RUQ(cm)       RLQ(cm)       LUQ(cm)        LLQ(cm)
4.35
Biometry
BPD:      85.9  mm     G. Age:  34w 5d          6  %    CI:        77.54   %   70 - 86
FL/HC:      23.3   %   20.8 -
HC:      308.8  mm     G. Age:  34w 3d        < 3  %    HC/AC:      0.94       0.92 -
AC:      329.5  mm     G. Age:  36w 6d         49  %    FL/BPD:     83.8   %   71 - 87
FL:         72  mm     G. Age:  36w 6d         37  %    FL/AC:      21.9   %   20 - 24
HUM:      60.7  mm     G. Age:  35w 1d         31  %

Est. FW:    2511  gm      6 lb 7 oz     48  %
Gestational Age

LMP:           37w 3d       Date:   02/10/16                 EDD:   11/16/16
U/S Today:     35w 5d                                        EDD:   11/28/16
Best:          37w 3d    Det. By:   LMP  (02/10/16)          EDD:   11/16/16
Anatomy

Cranium:               Appears normal         Aortic Arch:            Previously seen
Cavum:                 Previously seen        Ductal Arch:            Previously seen
Ventricles:            Appears normal         Diaphragm:              Previously seen
Choroid Plexus:        Previously seen        Stomach:                Appears normal, left
sided
Cerebellum:            Previously seen        Abdomen:                Previously seen
Posterior Fossa:       Previously seen        Abdominal Wall:         Previously seen
Nuchal Fold:           Previously seen        Cord Vessels:           Previously seen
Face:                  Orbits and profile     Kidneys:                Appear normal
previously seen
Lips:                  Previously seen        Bladder:                Appears normal
Thoracic:              Appears normal         Spine:                  Previously seen
Heart:                 Appears normal         Upper Extremities:      Previously seen
(4CH, axis, and situs
RVOT:                  Previously seen        Lower Extremities:      Previously seen
LVOT:                  Appears normal

Other:  Fetus appears to be a female. Heels visualized previously. Nasal
bone visualized previously.
Cervix Uterus Adnexa

Cervix
Not visualized (advanced GA >80wks)

Uterus
No abnormality visualized.

Left Ovary
No adnexal mass visualized.

Right Ovary
No adnexal mass visualized.

Cul De Sac:   No free fluid seen.

Adnexa:       No abnormality visualized.
Impression

SIUP at 37+3 weeks
Cephalic presentation
Normal interval anatomy; anatomic survey complete
Normal amniotic fluid volume
Appropriate interval growth with EFW at the 48th %tile
Recommendations

Follow-up as clinically indicated

## 2018-04-21 ENCOUNTER — Encounter (HOSPITAL_COMMUNITY): Payer: Self-pay

## 2018-04-21 ENCOUNTER — Ambulatory Visit (HOSPITAL_COMMUNITY)
Admission: EM | Admit: 2018-04-21 | Discharge: 2018-04-21 | Disposition: A | Discharge status: Home / Self Care | Payer: Medicaid Other | Attending: Family Medicine | Admitting: Family Medicine

## 2018-04-21 DIAGNOSIS — R0602 Shortness of breath: Secondary | ICD-10-CM

## 2018-04-21 MED ORDER — ALBUTEROL SULFATE (2.5 MG/3ML) 0.083% IN NEBU: 2.5000 mg | INHALATION_SOLUTION | Freq: Four times a day (QID) | RESPIRATORY_TRACT | 12 refills | Status: AC | PRN

## 2018-04-21 NOTE — Discharge Instructions (Signed)
We are refilling your nebulizer solution as requested Follow up as needed for continued or worsening symptoms

## 2018-04-21 NOTE — ED Provider Notes (Signed)
[facility]    CSN: [id number] Arrival date & time: 04/21/18  1043     History   Chief Complaint Chief Complaint  Patient presents with  . Shortness of Breath  . Chest Tightness    HPI Erin Matthews is a 28 y.o. female.   Pt is a 28 year old female that presents with mild chest tightness and shortness of breath.  The symptoms wax and wane.  Erin Matthews does have a past medical history of asthma.  Erin Matthews is out of her nebulizer solution.  Reports that her symptoms typically flareup around season changes.  Erin Matthews denies any history of allergies or taking allergy medicine.  Denies any cough, chest congestion, palpitations, nasal congestion, rhinorrhea, sore throat, ear pain. Denies any history of PE, DVT.  No calf swelling or tenderness.  No recent traveling or sick contacts.  No fevers, chills, myalgias, sweats.  ROS per HPI      Past Medical History:  Diagnosis Date  . Gestational diabetes   . Marijuana use   . Nicotine dependence 08/02/2013  . Tibial plateau fracture, left 08/02/2013  . Unspecified vitamin D deficiency 08/02/2013    Patient Active Problem List   Diagnosis Date Noted  . Non-reactive NST (non-stress test) 11/15/2016  . GDM, class A1 11/15/2016  . SVD (spontaneous vaginal delivery) 11/15/2016  . GDM (gestational diabetes mellitus) 08/12/2016  . Supervision of high risk pregnancy, antepartum 04/27/2016  . Marijuana use 08/06/2013  . Low vitamin D level 08/02/2013    Past Surgical History:  Procedure Laterality Date  . FASCIOTOMY Left 08/03/2013   Procedure: ANTERIOR COMPARTMENT FASCIOTOMY;  Surgeon: Budd Palmer, MD;  Location: [facility] OR;  Service: Orthopedics;  Laterality: Left;  . MENISCUS REPAIR Left 08/03/2013   Procedure: REPAIR OF LATERAL MENISCUS;  Surgeon: Budd Palmer, MD;  Location: [facility] OR;  Service: Orthopedics;  Laterality: Left;  . ORIF TIBIA PLATEAU Left 08/03/2013   Procedure: OPEN REDUCTION INTERNAL FIXATION (ORIF) TIBIAL PLATEAU WITH  REPAIR OF TIBIAL EMMINENCE;  Surgeon: Budd Palmer, MD;  Location: [facility] OR;  Service: Orthopedics;  Laterality: Left;    OB History    Gravida  1   Para  1   Term  1   Preterm      AB      Living  1     SAB      TAB      Ectopic      Multiple  0   Live Births  1            Home Medications    Prior to Admission medications   Medication Sig Start Date End Date Taking? Authorizing Provider  acetaminophen (TYLENOL) 325 MG tablet Take 650 mg by mouth every 6 (six) hours as needed.    [provider]  albuterol (PROVENTIL) (2.5 MG/3ML) 0.083% nebulizer solution Take 3 mLs (2.5 mg total) by nebulization every 6 (six) hours as needed for wheezing or shortness of breath. 04/21/18   Janace Aris, NP  norgestimate-ethinyl estradiol (ORTHO-CYCLEN,SPRINTEC,PREVIFEM) 0.25-35 MG-MCG tablet Take 1 tablet daily by mouth. 12/27/16   Constant, Peggy, MD    Family History Family History  Problem Relation Age of Onset  . Diabetes Paternal Uncle     Social History Social History   Tobacco Use  . Smoking status: Former Smoker    Packs/day: 0.75    Types: Cigarettes    Last attempt to quit: 03/23/2016    Years since quitting:  2.0  . Smokeless tobacco: Never Used  Substance Use Topics  . Alcohol use: No    Comment: social   . Drug use: No    Types: Marijuana    Comment: denies illicity drug use      Allergies   Patient has no known allergies.   Review of Systems Review of Systems   Physical Exam Triage Vital Signs ED Triage Vitals  Enc Vitals Group     BP 04/21/18 1051 114/67     Pulse Rate 04/21/18 1051 99     Resp 04/21/18 1051 16     Temp 04/21/18 1051 98.8 F (37.1 C)     Temp Source 04/21/18 1051 Oral     SpO2 04/21/18 1051 100 %     Weight --      Height --      Head Circumference --      Peak Flow --      Pain Score 04/21/18 1049 5     Pain Loc --      Pain Edu? --      Excl. in GC? --    No data found.  Updated Vital Signs BP  114/67 (BP Location: Right Arm)   Pulse 99   Temp 98.8 F (37.1 C) (Oral)   Resp 16   SpO2 100%   Visual Acuity Right Eye Distance:   Left Eye Distance:   Bilateral Distance:    Right Eye Near:   Left Eye Near:    Bilateral Near:     Physical Exam Vitals signs and nursing note reviewed.  Constitutional:      General: Erin Matthews is not in acute distress.    Appearance: Erin Matthews is well-developed. Erin Matthews is not ill-appearing, toxic-appearing or diaphoretic.  HENT:     Head: Normocephalic and atraumatic.     Mouth/Throat:     Mouth: Mucous membranes are moist.     Pharynx: Oropharynx is clear.  Neck:     Musculoskeletal: Normal range of motion.  Cardiovascular:     Rate and Rhythm: Normal rate and regular rhythm.  Pulmonary:     Effort: Pulmonary effort is normal. No respiratory distress.     Breath sounds: Normal breath sounds. No wheezing.  Musculoskeletal: Normal range of motion.  Skin:    General: Skin is warm.  Neurological:     Mental Status: Erin Matthews is alert.  Psychiatric:        Mood and Affect: Mood normal.      UC Treatments / Results  Labs (all labs ordered are listed, but only abnormal results are displayed) Labs Reviewed - No data to display  EKG None  Radiology No results found.  Procedures Procedures (including critical care time)  Medications Ordered in UC Medications - No data to display  Initial Impression / Assessment and Plan / UC Course  I have reviewed the triage vital signs and the nursing notes.  Pertinent labs & imaging results that were available during my care of the patient were reviewed by me and considered in my medical decision making (see chart for details).     Patient is a 28 year old female with past medical history of asthma that presents with waxing waning chest tightness and shortness of breath. Erin Matthews is here requesting refill on her albuterol nebulizer solution. Erin Matthews has no other concerning signs or symptoms. Exam normal Vital  signs stable and Erin Matthews is nontoxic or ill-appearing We will refill her medication and have her follow-up as needed for continued or  worsening symptoms Final Clinical Impressions(s) / UC Diagnoses   Final diagnoses:  None     Discharge Instructions     We are refilling your nebulizer solution as requested Follow up as needed for continued or worsening symptoms     ED Prescriptions    Medication Sig Dispense Auth. Provider   albuterol (PROVENTIL) (2.5 MG/3ML) 0.083% nebulizer solution Take 3 mLs (2.5 mg total) by nebulization every 6 (six) hours as needed for wheezing or shortness of breath. 75 mL Dahlia Byes A, NP     Controlled Substance Prescriptions [place] Controlled Substance Registry consulted? Not Applicable   Janace Aris, NP 04/21/18 1213

## 2018-04-21 NOTE — ED Triage Notes (Signed)
Pt presents with chest tightness & discomfort from chronic asthma symptoms that she usually experiences with season & weather change.

## 2018-08-16 ENCOUNTER — Other Ambulatory Visit: Payer: Self-pay

## 2018-08-16 DIAGNOSIS — Z20822 Contact with and (suspected) exposure to covid-19: Secondary | ICD-10-CM

## 2018-08-23 LAB — NOVEL CORONAVIRUS, NAA: SARS-CoV-2, NAA: NOT DETECTED

## 2018-09-14 ENCOUNTER — Ambulatory Visit (HOSPITAL_COMMUNITY)
Admission: EM | Admit: 2018-09-14 | Discharge: 2018-09-14 | Disposition: A | Discharge status: Home / Self Care | Payer: Self-pay | Attending: Internal Medicine | Admitting: Internal Medicine

## 2018-09-14 ENCOUNTER — Other Ambulatory Visit: Payer: Self-pay

## 2018-09-14 ENCOUNTER — Ambulatory Visit (INDEPENDENT_AMBULATORY_CARE_PROVIDER_SITE_OTHER): Payer: Self-pay

## 2018-09-14 DIAGNOSIS — S93601A Unspecified sprain of right foot, initial encounter: Secondary | ICD-10-CM

## 2018-09-14 MED ORDER — IBUPROFEN 600 MG PO TABS: 600.0000 mg | ORAL_TABLET | Freq: Four times a day (QID) | ORAL | 0 refills | Status: DC | PRN

## 2018-09-14 NOTE — ED Triage Notes (Signed)
Pt sts right foot pain in 4th and 5th digit; pt denies injury

## 2018-09-14 NOTE — ED Provider Notes (Signed)
[facility]    CSN: [id number] Arrival date & time: 09/14/18  1020      History   Chief Complaint No chief complaint on file.   HPI Erin Matthews is a 28 y.o. female with no past medical history comes to urgent care with 2-day history of right foot pain.  Pain started when she woke up from bed and is gotten progressively worse over the past 24 hours.  She tried ibuprofen with partial relief.  She is unable to bear weight on that foot because of worsening pain.  She denies any trauma to the foot.  No swelling to the right foot.  HPI  Past Medical History:  Diagnosis Date  . Gestational diabetes   . Marijuana use   . Nicotine dependence 08/02/2013  . Tibial plateau fracture, left 08/02/2013  . Unspecified vitamin D deficiency 08/02/2013    Patient Active Problem List   Diagnosis Date Noted  . Non-reactive NST (non-stress test) 11/15/2016  . GDM, class A1 11/15/2016  . SVD (spontaneous vaginal delivery) 11/15/2016  . GDM (gestational diabetes mellitus) 08/12/2016  . Supervision of high risk pregnancy, antepartum 04/27/2016  . Marijuana use 08/06/2013  . Low vitamin D level 08/02/2013    Past Surgical History:  Procedure Laterality Date  . FASCIOTOMY Left 08/03/2013   Procedure: ANTERIOR COMPARTMENT FASCIOTOMY;  Surgeon: Rozanna Box, MD;  Location: [facility];  Service: Orthopedics;  Laterality: Left;  . MENISCUS REPAIR Left 08/03/2013   Procedure: REPAIR OF LATERAL MENISCUS;  Surgeon: Rozanna Box, MD;  Location: [facility];  Service: Orthopedics;  Laterality: Left;  . ORIF TIBIA PLATEAU Left 08/03/2013   Procedure: OPEN REDUCTION INTERNAL FIXATION (ORIF) TIBIAL PLATEAU WITH REPAIR OF TIBIAL EMMINENCE;  Surgeon: Rozanna Box, MD;  Location: [facility];  Service: Orthopedics;  Laterality: Left;    OB History    Gravida  1   Para  1   Term  1   Preterm      AB      Living  1     SAB      TAB      Ectopic      Multiple  0   Live Births  1           Home Medications    Prior to Admission medications   Medication Sig Start Date End Date Taking? Authorizing Provider  acetaminophen (TYLENOL) 325 MG tablet Take 650 mg by mouth every 6 (six) hours as needed.    [provider]  albuterol (PROVENTIL) (2.5 MG/3ML) 0.083% nebulizer solution Take 3 mLs (2.5 mg total) by nebulization every 6 (six) hours as needed for wheezing or shortness of breath. 04/21/18   Orvan July, NP  norgestimate-ethinyl estradiol (ORTHO-CYCLEN,SPRINTEC,PREVIFEM) 0.25-35 MG-MCG tablet Take 1 tablet daily by mouth. 12/27/16   Constant, Peggy, MD    Family History Family History  Problem Relation Age of Onset  . Diabetes Paternal Uncle     Social History Social History   Tobacco Use  . Smoking status: Former Smoker    Packs/day: 0.75    Types: Cigarettes    Quit date: 03/23/2016    Years since quitting: 2.4  . Smokeless tobacco: Never Used  Substance Use Topics  . Alcohol use: No    Comment: social   . Drug use: No    Types: Marijuana    Comment: denies illicity drug use      Allergies   Patient has no known  allergies.   Review of Systems Review of Systems  Constitutional: Negative for activity change, chills, fatigue and fever.  HENT: Negative.   Respiratory: Negative.   Gastrointestinal: Negative.   Genitourinary: Negative for dysuria, hematuria and urgency.  Musculoskeletal: Positive for arthralgias, gait problem and myalgias. Negative for joint swelling, neck pain and neck stiffness.  Skin: Negative for rash and wound.  Neurological: Negative for dizziness, facial asymmetry and weakness.     Physical Exam Triage Vital Signs ED Triage Vitals  Enc Vitals Group     BP      Pulse      Resp      Temp      Temp src      SpO2      Weight      Height      Head Circumference      Peak Flow      Pain Score      Pain Loc      Pain Edu?      Excl. in GC?    No data found.  Updated Vital Signs There were no vitals  taken for this visit.  Visual Acuity Right Eye Distance:   Left Eye Distance:   Bilateral Distance:    Right Eye Near:   Left Eye Near:    Bilateral Near:     Physical Exam Constitutional:      General: She is in acute distress.     Appearance: Normal appearance. She is not ill-appearing or toxic-appearing.  Cardiovascular:     Rate and Rhythm: Normal rate and regular rhythm.     Pulses: Normal pulses.     Heart sounds: Normal heart sounds.  Pulmonary:     Effort: Pulmonary effort is normal.     Breath sounds: Normal breath sounds.  Abdominal:     General: Bowel sounds are normal.     Palpations: Abdomen is soft.  Musculoskeletal:        General: Tenderness present. No swelling, deformity or signs of injury.     Comments: Exquisite tenderness over the metatarsophalangeal joint of the fourth and fifth right toes.  Range of motion is severely limited by pain.  Skin:    General: Skin is warm.     Capillary Refill: Capillary refill takes less than 2 seconds.  Neurological:     General: No focal deficit present.     Mental Status: She is alert and oriented to person, place, and time.      UC Treatments / Results  Labs (all labs ordered are listed, but only abnormal results are displayed) Labs Reviewed - No data to display  EKG   Radiology No results found.  Procedures Procedures (including critical care time)  Medications Ordered in UC Medications - No data to display  Initial Impression / Assessment and Plan / UC Course  I have reviewed the triage vital signs and the nursing notes.  Pertinent labs & imaging results that were available during my care of the patient were reviewed by me and considered in my medical decision making (see chart for details).     1.  Right foot pain: X-ray of the right foot was independently reviewed by me and was negative for any acute fracture. Ibuprofen as needed for pain  2.  History of vitamin D deficiency Patient advised  to recheck vitamin D levels Patient will need a primary care physician Final Clinical Impressions(s) / UC Diagnoses   Final diagnoses:  None  Discharge Instructions   None    ED Prescriptions    None     Controlled Substance Prescriptions [place] Controlled Substance Registry consulted? No   Merrilee JanskyLamptey, Shyasia Funches O, MD 09/14/18 1153

## 2020-02-16 NOTE — L&D Delivery Note (Signed)
Delivery Note Called to room and patient was complete and pushing. Head delivered LOT. Tight nuchal and body cord present. Reduced at perineum. Shoulder and body delivered in usual fashion. At [phone] a viable female was delivered via Vaginal, Spontaneous (Presentation: LOT; LOA).  Infant with spontaneous cry, placed on mother's abdomen, dried and stimulated. Cord clamped x 2 after two-minute delay, and cut by FOB. Cord blood drawn. Placenta delivered spontaneously with gentle cord traction. Appears intact. Fundus firm with massage and Pitocin. Labia, perineum, vagina, and cervix inspected.   APGAR: 8, 9; weight  2835g .   Cord: 3VC with the following complications:None   Anesthesia: Epidural Episiotomy: None Lacerations: 2nd degree perineal with repair Suture Repair: 3.0 Vicryl Est. Blood Loss (mL): 50  Mom to postpartum.  Baby to Couplet care / Skin to Skin.  Clayton Bibles, CNM 10/05/20 7:11 AM

## 2020-02-25 ENCOUNTER — Other Ambulatory Visit: Payer: Self-pay

## 2020-02-28 ENCOUNTER — Ambulatory Visit (INDEPENDENT_AMBULATORY_CARE_PROVIDER_SITE_OTHER): Payer: Self-pay

## 2020-02-28 ENCOUNTER — Other Ambulatory Visit: Payer: Self-pay

## 2020-02-28 DIAGNOSIS — Z3201 Encounter for pregnancy test, result positive: Secondary | ICD-10-CM

## 2020-02-28 DIAGNOSIS — Z3200 Encounter for pregnancy test, result unknown: Secondary | ICD-10-CM

## 2020-02-28 LAB — POCT URINE PREGNANCY: Preg Test, Ur: POSITIVE — AB

## 2020-02-28 NOTE — Progress Notes (Signed)
Patient was assessed and managed by nursing staff during this encounter. I have reviewed the chart and agree with the documentation and plan. I have also made any necessary editorial changes.  Suhaib Guzzo, MD 02/28/2020 3:58 PM    

## 2020-02-28 NOTE — Progress Notes (Signed)
..   Ms. Sleep presents today for UPT. She has no unusual complaints. LMP:01-05-20    OBJECTIVE: Appears well, in no apparent distress.  OB History    Gravida  2   Para  1   Term  1   Preterm      AB      Living  1     SAB      IAB      Ectopic      Multiple  0   Live Births  1          Home UPT Result:Positive In-Office UPT result: Positive I have reviewed the patient's medical, obstetrical, social, and family histories, and medications.   ASSESSMENT: Positive pregnancy test  PLAN Prenatal care to be completed at: [facility]

## 2020-03-13 ENCOUNTER — Ambulatory Visit (INDEPENDENT_AMBULATORY_CARE_PROVIDER_SITE_OTHER): Payer: Medicaid Other

## 2020-03-13 ENCOUNTER — Other Ambulatory Visit: Payer: Self-pay

## 2020-03-13 VITALS — BP 119/72 | HR 120 | Ht 61.0 in | Wt 113.5 lb

## 2020-03-13 DIAGNOSIS — Z3481 Encounter for supervision of other normal pregnancy, first trimester: Secondary | ICD-10-CM

## 2020-03-13 DIAGNOSIS — O3680X0 Pregnancy with inconclusive fetal viability, not applicable or unspecified: Secondary | ICD-10-CM | POA: Diagnosis not present

## 2020-03-13 DIAGNOSIS — Z789 Other specified health status: Secondary | ICD-10-CM | POA: Diagnosis not present

## 2020-03-13 DIAGNOSIS — Z3A09 9 weeks gestation of pregnancy: Secondary | ICD-10-CM

## 2020-03-13 MED ORDER — BLOOD PRESSURE KIT DEVI: 1.0000 | 0 refills | Status: AC

## 2020-03-13 NOTE — Progress Notes (Signed)
Patient was assessed and managed by nursing staff during this encounter. I have reviewed the chart and agree with the documentation and plan. I have also made any necessary editorial changes.  Catalina Antigua, MD 03/13/2020 2:32 PM

## 2020-03-13 NOTE — Progress Notes (Signed)
PRENATAL INTAKE SUMMARY  Erin Matthews presents today New OB Nurse Interview.  OB History    Gravida  2   Para  1   Term  1   Preterm      AB      Living  1     SAB      IAB      Ectopic      Multiple  0   Live Births  1          I have reviewed the patient's medical, obstetrical, social, and family histories, medications, and available lab results.  SUBJECTIVE She has no unusual complaints  OBJECTIVE Initial Physical Exam (New OB)  GENERAL APPEARANCE: alert, well appearing   ASSESSMENT Normal pregnancy  PLAN Prenatal care to be completed at [facility] labs to be completed at [facility] Provider visit Baby Scripts ordered Blood pressure kit ordered U/S performed today reveals single live IUP at 39w5dby [place] FHR 162. PHQ 2 score: 0 GAD 7 score: 0

## 2020-03-17 ENCOUNTER — Encounter: Payer: Self-pay | Admitting: Advanced Practice Midwife

## 2020-03-17 ENCOUNTER — Other Ambulatory Visit (HOSPITAL_COMMUNITY)
Admission: RE | Admit: 2020-03-17 | Discharge: 2020-03-17 | Disposition: A | Discharge status: Home / Self Care | Payer: Medicaid Other | Source: Ambulatory Visit | Attending: Advanced Practice Midwife | Admitting: Advanced Practice Midwife

## 2020-03-17 ENCOUNTER — Other Ambulatory Visit: Payer: Self-pay

## 2020-03-17 ENCOUNTER — Ambulatory Visit (INDEPENDENT_AMBULATORY_CARE_PROVIDER_SITE_OTHER): Payer: Medicaid Other | Admitting: Advanced Practice Midwife

## 2020-03-17 VITALS — BP 120/77 | HR 94 | Wt 115.8 lb

## 2020-03-17 DIAGNOSIS — Z3401 Encounter for supervision of normal first pregnancy, first trimester: Secondary | ICD-10-CM | POA: Diagnosis not present

## 2020-03-17 DIAGNOSIS — Z8632 Personal history of gestational diabetes: Secondary | ICD-10-CM

## 2020-03-17 DIAGNOSIS — Z3A10 10 weeks gestation of pregnancy: Secondary | ICD-10-CM | POA: Diagnosis not present

## 2020-03-17 DIAGNOSIS — Z3481 Encounter for supervision of other normal pregnancy, first trimester: Secondary | ICD-10-CM

## 2020-03-17 DIAGNOSIS — O219 Vomiting of pregnancy, unspecified: Secondary | ICD-10-CM

## 2020-03-17 MED ORDER — PROMETHAZINE HCL 12.5 MG PO TABS: 12.5000 mg | ORAL_TABLET | Freq: Every evening | ORAL | 2 refills | Status: DC | PRN

## 2020-03-17 MED ORDER — PREPLUS 27-1 MG PO TABS: 1.0000 | ORAL_TABLET | Freq: Every day | ORAL | 13 refills | Status: AC

## 2020-03-17 MED ORDER — ASPIRIN EC 81 MG PO TBEC: 81.0000 mg | DELAYED_RELEASE_TABLET | Freq: Every day | ORAL | 5 refills | Status: DC

## 2020-03-17 MED ORDER — METOCLOPRAMIDE HCL 10 MG PO TABS: 10.0000 mg | ORAL_TABLET | Freq: Three times a day (TID) | ORAL | 2 refills | Status: DC | PRN

## 2020-03-17 NOTE — Patient Instructions (Signed)
Obstetrics: Normal and Problem Pregnancies (7th ed., pp. 102-121). [place], [place]: Elsevier."> Textbook of Family Medicine (9th ed., pp. 365-410). [place], [place]: Else[employer].">  First Trimester of Pregnancy  The first trimester of pregnancy starts on the first day of your last menstrual period until the end of week 12. This is months 1 through 3 of pregnancy. A week after a sperm fertilizes an egg, the egg will implant into the wall of the uterus and begin to develop into a baby. By the end of 12 weeks, all the baby's organs will be formed and the baby will be 2-3 inches in size. Body changes during your first trimester Your body goes through many changes during pregnancy. The changes vary and generally return to normal after your baby is born. Physical changes  You may gain or lose weight.  Your breasts may begin to grow larger and become tender. The tissue that surrounds your nipples (areola) may become darker.  Dark spots or blotches (chloasma or mask of pregnancy) may develop on your face.  You may have changes in your hair. These can include thickening or thinning of your hair or changes in texture. Health changes  You may feel nauseous, and you may vomit.  You may have heartburn.  You may develop headaches.  You may develop constipation.  Your gums may bleed and may be sensitive to brushing and flossing. Other changes  You may tire easily.  You may urinate more often.  Your menstrual periods will stop.  You may have a loss of appetite.  You may develop cravings for certain kinds of food.  You may have changes in your emotions from day to day.  You may have more vivid and strange dreams. Follow these instructions at home: Medicines  Follow your health care provider's instructions regarding medicine use. Specific medicines may be either safe or unsafe to take during pregnancy. Do not take any medicines unless told to by your health care provider.  Take a  prenatal vitamin that contains at least 600 micrograms (mcg) of folic acid. Eating and drinking  Eat a healthy diet that includes fresh fruits and vegetables, whole grains, good sources of protein such as meat, eggs, or tofu, and low-fat dairy products.  Avoid raw meat and unpasteurized juice, milk, and cheese. These carry germs that can harm you and your baby.  If you feel nauseous or you vomit: ? Eat 4 or 5 small meals a day instead of 3 large meals. ? Try eating a few soda crackers. ? Drink liquids between meals instead of during meals.  You may need to take these actions to prevent or treat constipation: ? Drink enough fluid to keep your urine pale yellow. ? Eat foods that are high in fiber, such as beans, whole grains, and fresh fruits and vegetables. ? Limit foods that are high in fat and processed sugars, such as fried or sweet foods. Activity  Exercise only as directed by your health care provider. Most people can continue their usual exercise routine during pregnancy. Try to exercise for 30 minutes at least 5 days a week.  Stop exercising if you develop pain or cramping in the lower abdomen or lower back.  Avoid exercising if it is very hot or humid or if you are at high altitude.  Avoid heavy lifting.  If you choose to, you may have sex unless your health care provider tells you not to. Relieving pain and discomfort  Wear a good support bra to relieve breast   tenderness.  Rest with your legs elevated if you have leg cramps or low back pain.  If you develop bulging veins (varicose veins) in your legs: ? Wear support hose as told by your health care provider. ? Elevate your feet for 15 minutes, 3-4 times a day. ? Limit salt in your diet. Safety  Wear your seat belt at all times when driving or riding in a car.  Talk with your health care provider if someone is verbally or physically abusive to you.  Talk with your health care provider if you are feeling sad or have  thoughts of hurting yourself. Lifestyle  Do not use hot tubs, steam rooms, or saunas.  Do not douche. Do not use tampons or scented sanitary pads.  Do not use herbal remedies, alcohol, illegal drugs, or medicines that are not approved by your health care provider. Chemicals in these products can harm your baby.  Do not use any products that contain nicotine or tobacco, such as cigarettes, e-cigarettes, and chewing tobacco. If you need help quitting, ask your health care provider.  Avoid cat litter boxes and soil used by cats. These carry germs that can cause birth defects in the baby and possibly loss of the unborn baby (fetus) by miscarriage or stillbirth. General instructions  During routine prenatal visits in the first trimester, your health care provider will do a physical exam, perform necessary tests, and ask you how things are going. Keep all follow-up visits. This is important.  Ask for help if you have counseling or nutritional needs during pregnancy. Your health care provider can offer advice or refer you to specialists for help with various needs.  Schedule a dentist appointment. At home, brush your teeth with a soft toothbrush. Floss gently.  Write down your questions. Take them to your prenatal visits. Where to find more information  American Pregnancy Association: americanpregnancy.org  American College of Obstetricians and Gynecologists: acog.org/en/Womens%20Health/Pregnancy  Office on Women's Health: womenshealth.gov/pregnancy Contact a health care provider if you have:  Dizziness.  A fever.  Mild pelvic cramps, pelvic pressure, or nagging pain in the abdominal area.  Nausea, vomiting, or diarrhea that lasts for 24 hours or longer.  A bad-smelling vaginal discharge.  Pain when you urinate.  Known exposure to a contagious illness, such as chickenpox, measles, Zika virus, HIV, or hepatitis. Get help right away if you have:  Spotting or bleeding from your  vagina.  Severe abdominal cramping or pain.  Shortness of breath or chest pain.  Any kind of trauma, such as from a fall or a car crash.  New or increased pain, swelling, or redness in an arm or leg. Summary  The first trimester of pregnancy starts on the first day of your last menstrual period until the end of week 12 (months 1 through 3).  Eating 4 or 5 small meals a day rather than 3 large meals may help to relieve nausea and vomiting.  Do not use any products that contain nicotine or tobacco, such as cigarettes, e-cigarettes, and chewing tobacco. If you need help quitting, ask your health care provider.  Keep all follow-up visits. This is important. This information is not intended to replace advice given to you by your health care provider. Make sure you discuss any questions you have with your health care provider. Document Revised: 07/11/2019 Document Reviewed: 05/17/2019 Elsevier Patient Education  2021 Elsevier Inc.  

## 2020-03-17 NOTE — Progress Notes (Signed)
Subjective:   Erin Matthews is a 30 y.o. G2P1001 at 47w2dby LMP being seen today for her first obstetrical visit.  Her obstetrical history is significant for vaginal delivery at term x 1 and has Marijuana use; Encounter for supervision of normal pregnancy in multigravida in first trimester; and History of gestational diabetes on their problem list.. Patient does intend to breast feed. Pregnancy history fully reviewed.  Patient reports nausea.  HISTORY: OB History  Gravida Para Term Preterm AB Living  2 1 1  0 0 1  SAB IAB Ectopic Multiple Live Births  0 0 0 0 1    # Outcome Date GA Lbr Len/2nd Weight Sex Delivery Anes PTL Lv  2 Current           1 Term 11/15/16 [id number]:39 / 00:46 6 lb 7.2 oz (2.925 kg) F Vag-Spont EPI  LIV     Name: Bendall,GIRL Ival     Apgar1: 8  Apgar5: 9   Past Medical History:  Diagnosis Date  . Gestational diabetes   . Marijuana use   . Nicotine dependence 08/02/2013  . Tibial plateau fracture, left 08/02/2013  . Unspecified vitamin D deficiency 08/02/2013   Past Surgical History:  Procedure Laterality Date  . FASCIOTOMY Left 08/03/2013   Procedure: ANTERIOR COMPARTMENT FASCIOTOMY;  Surgeon: MiRozanna BoxMD;  [place] [facility];  Laterality: Left;  . MENISCUS REPAIR Left 08/03/2013   Procedure: REPAIR OF LATERAL MENISCUS;  Surgeon: MiRozanna BoxMD;  [facility]: [facility];  Laterality: Left;  . ORIF TIBIA PLATEAU Left 08/03/2013   Procedure: OPEN REDUCTION INTERNAL FIXATION (ORIF) TIBIAL PLATEAU WITH REPAIR OF TIBIAL EMMINENCE;  Surgeon: MiRozanna BoxMD;  [facility]: [facility];  Laterality: Left;   Family History  Problem Relation Age of Onset  . Diabetes Paternal Uncle    Social History   Tobacco Use  . Smoking status: Former Smoker    Packs/day: 0.75    Types: Cigarettes    Quit date: 03/23/2016    Years since quitting: 3.9  . Smokeless tobacco: Never Used  Vaping Use   . Vaping Use: Never used  Substance Use Topics  . Alcohol use: Not Currently    Comment: last drink 3 years ago  . Drug use: Not Currently    Comment: last used a week ago   No Known Allergies Current Outpatient Medications on File Prior to Visit  Medication Sig Dispense Refill  . albuterol (PROVENTIL) (2.5 MG/3ML) 0.083% nebulizer solution Take 3 mLs (2.5 mg total) by nebulization every 6 (six) hours as needed for wheezing or shortness of breath. 75 mL 12  . Blood Pressure Monitoring (BLOOD PRESSURE KIT) DEVI 1 kit by Does not apply route once a week. 1 each 0  . acetaminophen (TYLENOL) 325 MG tablet Take 650 mg by mouth every 6 (six) hours as needed. (Patient not taking: Reported on 03/17/2020)     No current facility-administered medications on file prior to visit.     Indications for ASA therapy (per uptodate) One of the following: Previous pregnancy with preeclampsia, especially early onset and with an adverse outcome No Multifetal gestation No Chronic hypertension No Type 1 or 2 diabetes mellitus No Chronic kidney disease No Autoimmune disease (antiphospholipid syndrome, systemic lupus erythematosus) No   Two or more of the following: Nulliparity No Obesity (body mass index >30 kg/m2) No Family history of preeclampsia in mother or sister No  Age ?36 years No Sociodemographic characteristics (African American race, low socioeconomic level) Yes Personal risk factors (eg, previous pregnancy with low birth weight or small for gestational age infant, previous adverse pregnancy outcome [eg, stillbirth], interval >10 years between pregnancies) No   Indications for early 1 hour GTT (per uptodate)  BMI >25 (>23 in Asian women) AND one of the following  Gestational diabetes mellitus in a previous pregnancy Yes  Exam   Vitals:   03/17/20 1503  BP: 120/77  Pulse: 94  Weight: 115 lb 12.8 oz (52.5 kg)      Uterus:     Pelvic Exam: Perineum: no hemorrhoids, normal perineum    Vulva: normal external genitalia, no lesions   Vagina:  normal mucosa, normal discharge   Cervix: no lesions and normal, pap smear done.    Adnexa: normal adnexa and no mass, fullness, tenderness   Bony Pelvis: average  System: General: well-developed, well-nourished female in no acute distress   Breast:  normal appearance, no masses or tenderness   Skin: normal coloration and turgor, no rashes   Neurologic: oriented, normal, negative, normal mood   Extremities: normal strength, tone, and muscle mass, ROM of all joints is normal   HEENT PERRLA, extraocular movement intact and sclera clear, anicteric   Mouth/Teeth mucous membranes moist, pharynx normal without lesions and dental hygiene good   Neck supple and no masses   Cardiovascular: regular rate and rhythm   Respiratory:  no respiratory distress, normal breath sounds   Abdomen: soft, non-tender; bowel sounds normal; no masses,  no organomegaly     Assessment:   Pregnancy: G2P1001 Patient Active Problem List   Diagnosis Date Noted  . History of gestational diabetes 03/17/2020  . Encounter for supervision of normal pregnancy in multigravida in first trimester 03/13/2020  . Marijuana use 08/06/2013     Plan:  1. Encounter for supervision of normal first pregnancy in first trimester --Anticipatory guidance about next visits/weeks of pregnancy given. --Next visit in 4 weeks in office for AFP  - Culture, OB Urine - Cytology - PAP( [place]) - Cervicovaginal ancillary only( [place]) - CBC/D/Plt+RPR+Rh+ABO+Rub Ab... - Genetic Screening - Babyscripts Schedule Optimization  2. History of gestational diabetes --Will start ASA therapy this pregnancy, with previous GDM and family hx, no hx of HTN so 81 mg dose daily. --A1C today for screening  - aspirin EC 81 MG tablet; Take 1 tablet (81 mg total) by mouth daily. Swallow whole.  Dispense: 30 tablet; Refill: 5 - Hemoglobin A1c  3. Nausea and vomiting during pregnancy  prior to [redacted] weeks gestation --Reglan TID at mealtimes and Phenergan PRN QHS for nausea --Discussed dietary changes with small frequent meals  - metoCLOPramide (REGLAN) 10 MG tablet; Take 1 tablet (10 mg total) by mouth 3 (three) times daily with meals as needed for nausea.  Dispense: 90 tablet; Refill: 2 - promethazine (PHENERGAN) 12.5 MG tablet; Take 1-2 tablets (12.5-25 mg total) by mouth at bedtime as needed for nausea or vomiting.  Dispense: 30 tablet; Refill: 2    Initial labs drawn. Continue prenatal vitamins. Discussed and offered genetic screening options, including Quad screen/AFP, NIPS testing, and option to decline testing. Benefits/risks/alternatives reviewed. Pt aware that anatomy US is form of genetic screening with lower accuracy in detecting trisomies than blood work.  Pt choosesgenetic screening today. NIPS: ordered. Ultrasound discussed; fetal anatomic survey: ordered. Problem list reviewed and updated. The nature of [facility] with multiple MDs and other  Advanced Practice Providers was explained to patient; also emphasized that residents, students are part of our team. Routine obstetric precautions reviewed. Return in about 4 weeks (around 04/14/2020).   Fatima Blank, CNM 03/17/20 3:49 PM

## 2020-03-18 ENCOUNTER — Other Ambulatory Visit: Payer: Self-pay | Admitting: Advanced Practice Midwife

## 2020-03-18 DIAGNOSIS — B9689 Other specified bacterial agents as the cause of diseases classified elsewhere: Secondary | ICD-10-CM

## 2020-03-18 LAB — CERVICOVAGINAL ANCILLARY ONLY
Bacterial Vaginitis (gardnerella): POSITIVE — AB
Candida Glabrata: NEGATIVE
Candida Vaginitis: NEGATIVE
Chlamydia: NEGATIVE
Comment: NEGATIVE
Comment: NEGATIVE
Comment: NEGATIVE
Comment: NEGATIVE
Comment: NEGATIVE
Comment: NORMAL
Neisseria Gonorrhea: NEGATIVE
Trichomonas: NEGATIVE

## 2020-03-18 MED ORDER — METRONIDAZOLE 500 MG PO TABS: 500.0000 mg | ORAL_TABLET | Freq: Two times a day (BID) | ORAL | 0 refills | Status: AC

## 2020-03-19 ENCOUNTER — Encounter: Payer: Self-pay | Admitting: Advanced Practice Midwife

## 2020-03-19 DIAGNOSIS — R768 Other specified abnormal immunological findings in serum: Secondary | ICD-10-CM | POA: Insufficient documentation

## 2020-03-19 LAB — CYTOLOGY - PAP
Comment: NEGATIVE
Diagnosis: NEGATIVE
High risk HPV: NEGATIVE

## 2020-03-19 LAB — CBC/D/PLT+RPR+RH+ABO+RUB AB...
Antibody Screen: NEGATIVE
Basophils Absolute: 0 10*3/uL (ref 0.0–0.2)
Basos: 0 %
EOS (ABSOLUTE): 0.1 10*3/uL (ref 0.0–0.4)
Eos: 1 %
HCV Ab: <0.1 s/co ratio (ref 0.0–0.9)
HIV Screen 4th Generation wRfx: NONREACTIVE
Hematocrit: 36.7 % (ref 34.0–46.6)
Hemoglobin: 12 g/dL (ref 11.1–15.9)
Hepatitis B Surface Ag: NEGATIVE
Immature Grans (Abs): 0 10*3/uL (ref 0.0–0.1)
Immature Granulocytes: 0 %
Lymphocytes Absolute: 1.5 10*3/uL (ref 0.7–3.1)
Lymphs: 18 %
MCH: 31 pg (ref 26.6–33.0)
MCHC: 32.7 g/dL (ref 31.5–35.7)
MCV: 95 fL (ref 79–97)
Monocytes Absolute: 0.7 10*3/uL (ref 0.1–0.9)
Monocytes: 8 %
Neutrophils Absolute: 6.3 10*3/uL (ref 1.4–7.0)
Neutrophils: 73 %
Platelets: 306 10*3/uL (ref 150–450)
RBC: 3.87 x10E6/uL (ref 3.77–5.28)
RDW: 13 % (ref 11.7–15.4)
RPR Ser Ql: REACTIVE — AB
Rh Factor: POSITIVE
Rubella Antibodies, IGG: 1.11 index (ref >0.99)
WBC: 8.6 10*3/uL (ref 3.4–10.8)

## 2020-03-19 LAB — RPR, QUANT+TP ABS (REFLEX)
Rapid Plasma Reagin, Quant: 1:1 {titer} — ABNORMAL HIGH
T Pallidum Abs: NONREACTIVE

## 2020-03-19 LAB — HCV INTERPRETATION

## 2020-03-19 LAB — HEMOGLOBIN A1C
Est. average glucose Bld gHb Est-mCnc: 103 mg/dL
Hgb A1c MFr Bld: 5.2 % (ref 4.8–5.6)

## 2020-03-20 LAB — URINE CULTURE, OB REFLEX

## 2020-03-20 LAB — CULTURE, OB URINE

## 2020-03-28 ENCOUNTER — Encounter: Payer: Self-pay | Admitting: Advanced Practice Midwife

## 2020-04-01 ENCOUNTER — Encounter: Payer: Self-pay | Admitting: Advanced Practice Midwife

## 2020-04-14 ENCOUNTER — Other Ambulatory Visit: Payer: Self-pay

## 2020-04-14 ENCOUNTER — Encounter: Payer: Self-pay | Admitting: Obstetrics and Gynecology

## 2020-04-14 ENCOUNTER — Ambulatory Visit (INDEPENDENT_AMBULATORY_CARE_PROVIDER_SITE_OTHER): Payer: Medicaid Other | Admitting: Obstetrics and Gynecology

## 2020-04-14 VITALS — BP 104/64 | HR 94 | Wt 120.0 lb

## 2020-04-14 DIAGNOSIS — Z8632 Personal history of gestational diabetes: Secondary | ICD-10-CM

## 2020-04-14 DIAGNOSIS — Z3481 Encounter for supervision of other normal pregnancy, first trimester: Secondary | ICD-10-CM

## 2020-04-14 NOTE — Progress Notes (Signed)
   PRENATAL VISIT NOTE  Subjective:  Erin Matthews is a 30 y.o. G2P1001 at [redacted]w[redacted]d being seen today for ongoing prenatal care.  She is currently monitored for the following issues for this low-risk pregnancy and has Marijuana use; Encounter for supervision of normal pregnancy in multigravida in first trimester; History of gestational diabetes; and Biological false positive RPR test on their problem list.  Patient reports no complaints.  Contractions: Not present. Vag. Bleeding: None.  Movement: Present. Denies leaking of fluid.   The following portions of the patient's history were reviewed and updated as appropriate: allergies, current medications, past family history, past medical history, past social history, past surgical history and problem list.   Objective:   Vitals:   04/14/20 1533  BP: 104/64  Pulse: 94  Weight: 120 lb (54.4 kg)    Fetal Status: Fetal Heart Rate (bpm): 154   Movement: Present     General:  Alert, oriented and cooperative. Patient is in no acute distress.  Skin: Skin is warm and dry. No rash noted.   Cardiovascular: Normal heart rate noted  Respiratory: Normal respiratory effort, no problems with respiration noted  Abdomen: Soft, gravid, appropriate for gestational age.  Pain/Pressure: Absent     Pelvic: Cervical exam deferred        Extremities: Normal range of motion.  Edema: None  Mental Status: Normal mood and affect. Normal behavior. Normal judgment and thought content.   Assessment and Plan:  Pregnancy: G2P1001 at [redacted]w[redacted]d 1. Encounter for supervision of normal pregnancy in multigravida in first trimester Patient is doing well without complaints Anatomy ultrasound ordered AFP next visit  2. History of gestational diabetes Normal A1c on prenatal  Glucola at 26-28 weeks  Preterm labor symptoms and general obstetric precautions including but not limited to vaginal bleeding, contractions, leaking of fluid and fetal movement were reviewed in detail  with the patient. Please refer to After Visit Summary for other counseling recommendations.   Return in about 4 weeks (around 05/12/2020) for in person, ROB, Low risk.  No future appointments.  Catalina Antigua, MD

## 2020-05-13 ENCOUNTER — Encounter: Payer: Medicaid Other | Admitting: Advanced Practice Midwife

## 2020-05-19 ENCOUNTER — Ambulatory Visit: Payer: Medicaid Other | Attending: Obstetrics and Gynecology

## 2020-05-19 ENCOUNTER — Other Ambulatory Visit: Payer: Self-pay | Admitting: Obstetrics and Gynecology

## 2020-05-19 ENCOUNTER — Other Ambulatory Visit: Payer: Self-pay

## 2020-05-19 ENCOUNTER — Other Ambulatory Visit: Payer: Self-pay | Admitting: *Deleted

## 2020-05-19 DIAGNOSIS — Z3481 Encounter for supervision of other normal pregnancy, first trimester: Secondary | ICD-10-CM | POA: Diagnosis present

## 2020-05-19 DIAGNOSIS — O35EXX0 Maternal care for other (suspected) fetal abnormality and damage, fetal genitourinary anomalies, not applicable or unspecified: Secondary | ICD-10-CM

## 2020-05-19 DIAGNOSIS — O358XX0 Maternal care for other (suspected) fetal abnormality and damage, not applicable or unspecified: Secondary | ICD-10-CM

## 2020-05-26 ENCOUNTER — Encounter: Payer: Medicaid Other | Admitting: Obstetrics and Gynecology

## 2020-05-26 ENCOUNTER — Encounter: Payer: Medicaid Other | Admitting: Advanced Practice Midwife

## 2020-06-02 ENCOUNTER — Other Ambulatory Visit: Payer: Self-pay

## 2020-06-02 ENCOUNTER — Ambulatory Visit (INDEPENDENT_AMBULATORY_CARE_PROVIDER_SITE_OTHER): Payer: Medicaid Other | Admitting: Nurse Practitioner

## 2020-06-02 VITALS — BP 107/68 | HR 74 | Wt 127.6 lb

## 2020-06-02 DIAGNOSIS — Z3A21 21 weeks gestation of pregnancy: Secondary | ICD-10-CM

## 2020-06-02 DIAGNOSIS — Z8632 Personal history of gestational diabetes: Secondary | ICD-10-CM

## 2020-06-02 DIAGNOSIS — M545 Low back pain, unspecified: Secondary | ICD-10-CM

## 2020-06-02 DIAGNOSIS — Z3481 Encounter for supervision of other normal pregnancy, first trimester: Secondary | ICD-10-CM

## 2020-06-02 MED ORDER — CYCLOBENZAPRINE HCL 10 MG PO TABS: 10.0000 mg | ORAL_TABLET | Freq: Three times a day (TID) | ORAL | 0 refills | Status: DC | PRN

## 2020-06-02 NOTE — Progress Notes (Signed)
    Subjective:  Erin Matthews is a 30 y.o. G2P1001 at [redacted]w[redacted]d being seen today for ongoing prenatal care.  She is currently monitored for the following issues for this low-risk pregnancy and has Marijuana use; Encounter for supervision of normal pregnancy in multigravida in first trimester; History of gestational diabetes; and Biological false positive RPR test on their problem list.  Patient reports back pain that started this weekend as she was cooking for family..  Contractions: Not present. Vag. Bleeding: None.  Movement: Present. Denies leaking of fluid.   The following portions of the patient's history were reviewed and updated as appropriate: allergies, current medications, past family history, past medical history, past social history, past surgical history and problem list. Problem list updated.  Objective:   Vitals:   06/02/20 1624  BP: 107/68  Pulse: 74  Weight: 127 lb 9.6 oz (57.9 kg)    Fetal Status: Fetal Heart Rate (bpm): 149 Fundal Height: 23 cm Movement: Present     General:  Alert, oriented and cooperative. Patient is in no acute distress.  Skin: Skin is warm and dry. No rash noted.   Cardiovascular: Normal heart rate noted  Respiratory: Normal respiratory effort, no problems with respiration noted  Abdomen: Soft, gravid, appropriate for gestational age. Pain/Pressure: Absent     Pelvic:  Cervical exam deferred        Extremities: Normal range of motion.  Edema: None  Mental Status: Normal mood and affect. Normal behavior. Normal judgment and thought content.  Had trouble changing positions on the exam table.  Needed help to sit up.  Urinalysis:      Assessment and Plan:  Pregnancy: G2P1001 at [redacted]w[redacted]d  1. Encounter for supervision of normal pregnancy in multigravida in first trimester Declined AFP  2. History of gestational diabetes   3. Acute low back pain without sciatica, unspecified back pain laterality Demonstrated stretching exercises Advised ice  to her back twice daily for 15 minutes at a time. Prescribed flexeril and advised to try 1/2 tablet at night.  Advised not to take and drive.  Preterm labor symptoms and general obstetric precautions including but not limited to vaginal bleeding, contractions, leaking of fluid and fetal movement were reviewed in detail with the patient. Please refer to After Visit Summary for other counseling recommendations.  Return in about 3 weeks (around 06/23/2020) for in person ROB.  Nolene Bernheim, RN, MSN, NP-BC Nurse Practitioner, [facility] for [employer], [facility] 06/02/2020 5:02 PM

## 2020-06-23 ENCOUNTER — Ambulatory Visit (INDEPENDENT_AMBULATORY_CARE_PROVIDER_SITE_OTHER): Payer: Medicaid Other | Admitting: Obstetrics and Gynecology

## 2020-06-23 ENCOUNTER — Other Ambulatory Visit: Payer: Self-pay

## 2020-06-23 ENCOUNTER — Encounter: Payer: Self-pay | Admitting: Obstetrics and Gynecology

## 2020-06-23 VITALS — BP 107/68 | HR 81 | Temp 98.6°F | Wt 128.0 lb

## 2020-06-23 DIAGNOSIS — Z3481 Encounter for supervision of other normal pregnancy, first trimester: Secondary | ICD-10-CM

## 2020-06-23 DIAGNOSIS — Z8632 Personal history of gestational diabetes: Secondary | ICD-10-CM

## 2020-06-23 NOTE — Progress Notes (Signed)
   PRENATAL VISIT NOTE  Subjective:  Erin Matthews is a 30 y.o. G2P1001 at [redacted]w[redacted]d being seen today for ongoing prenatal care.  She is currently monitored for the following issues for this low-risk pregnancy and has Marijuana use; Encounter for supervision of normal pregnancy in multigravida in first trimester; History of gestational diabetes; and Biological false positive RPR test on their problem list.  Patient reports no complaints.  Contractions: Not present. Vag. Bleeding: None.  Movement: Present. Denies leaking of fluid.   The following portions of the patient's history were reviewed and updated as appropriate: allergies, current medications, past family history, past medical history, past social history, past surgical history and problem list.   Objective:   Vitals:   06/23/20 0950  BP: 107/68  Pulse: 81  Temp: 98.6 F (37 C)  Weight: 128 lb (58.1 kg)    Fetal Status: Fetal Heart Rate (bpm): 143 Fundal Height: 24 cm Movement: Present     General:  Alert, oriented and cooperative. Patient is in no acute distress.  Skin: Skin is warm and dry. No rash noted.   Cardiovascular: Normal heart rate noted  Respiratory: Normal respiratory effort, no problems with respiration noted  Abdomen: Soft, gravid, appropriate for gestational age.  Pain/Pressure: Absent     Pelvic: Cervical exam deferred        Extremities: Normal range of motion.  Edema: None  Mental Status: Normal mood and affect. Normal behavior. Normal judgment and thought content.   Assessment and Plan:  Pregnancy: G2P1001 at [redacted]w[redacted]d 1. Encounter for supervision of normal pregnancy in multigravida in first trimester Patient is doing well without complaints Reviewed ultrasound report with patient. Patient scheduled for follow up anatomy ultrasound at end of the month due to bilateral urinary track dilation  2. History of gestational diabetes Normal A1c- third trimester labs and glucola at next visit  Preterm labor  symptoms and general obstetric precautions including but not limited to vaginal bleeding, contractions, leaking of fluid and fetal movement were reviewed in detail with the patient. Please refer to After Visit Summary for other counseling recommendations.   Return in about 4 weeks (around 07/21/2020) for in person, ROB, High risk, 2 hr glucola next visit.  Future Appointments  Date Time Provider Department Center  07/11/2020  9:45 AM [facility] NURSE [facility]  07/11/2020 10:00 AM [facility] US1 [facility]    Catalina Antigua, MD

## 2020-06-23 NOTE — Progress Notes (Signed)
ROB [redacted]w[redacted]d  CC: None    

## 2020-07-11 ENCOUNTER — Ambulatory Visit: Payer: Medicaid Other | Admitting: *Deleted

## 2020-07-11 ENCOUNTER — Encounter: Payer: Self-pay | Admitting: *Deleted

## 2020-07-11 ENCOUNTER — Other Ambulatory Visit: Payer: Self-pay | Admitting: Obstetrics

## 2020-07-11 ENCOUNTER — Other Ambulatory Visit: Payer: Self-pay

## 2020-07-11 ENCOUNTER — Ambulatory Visit: Payer: Medicaid Other | Attending: Obstetrics and Gynecology

## 2020-07-11 DIAGNOSIS — Z3481 Encounter for supervision of other normal pregnancy, first trimester: Secondary | ICD-10-CM | POA: Diagnosis present

## 2020-07-11 DIAGNOSIS — Z8632 Personal history of gestational diabetes: Secondary | ICD-10-CM

## 2020-07-11 DIAGNOSIS — O283 Abnormal ultrasonic finding on antenatal screening of mother: Secondary | ICD-10-CM | POA: Diagnosis not present

## 2020-07-11 DIAGNOSIS — O358XX0 Maternal care for other (suspected) fetal abnormality and damage, not applicable or unspecified: Secondary | ICD-10-CM | POA: Insufficient documentation

## 2020-07-11 DIAGNOSIS — O403XX0 Polyhydramnios, third trimester, not applicable or unspecified: Secondary | ICD-10-CM

## 2020-07-11 DIAGNOSIS — R768 Other specified abnormal immunological findings in serum: Secondary | ICD-10-CM | POA: Insufficient documentation

## 2020-07-11 DIAGNOSIS — Z3A26 26 weeks gestation of pregnancy: Secondary | ICD-10-CM

## 2020-07-11 DIAGNOSIS — O35EXX0 Maternal care for other (suspected) fetal abnormality and damage, fetal genitourinary anomalies, not applicable or unspecified: Secondary | ICD-10-CM

## 2020-07-21 ENCOUNTER — Other Ambulatory Visit: Payer: Self-pay

## 2020-07-21 ENCOUNTER — Encounter: Payer: Self-pay | Admitting: Obstetrics and Gynecology

## 2020-07-21 ENCOUNTER — Other Ambulatory Visit: Payer: Medicaid Other

## 2020-07-21 ENCOUNTER — Ambulatory Visit (INDEPENDENT_AMBULATORY_CARE_PROVIDER_SITE_OTHER): Payer: Medicaid Other | Admitting: Obstetrics and Gynecology

## 2020-07-21 VITALS — BP 114/68 | HR 73 | Wt 133.0 lb

## 2020-07-21 DIAGNOSIS — Z3481 Encounter for supervision of other normal pregnancy, first trimester: Secondary | ICD-10-CM

## 2020-07-21 DIAGNOSIS — O409XX0 Polyhydramnios, unspecified trimester, not applicable or unspecified: Secondary | ICD-10-CM | POA: Insufficient documentation

## 2020-07-21 DIAGNOSIS — Z23 Encounter for immunization: Secondary | ICD-10-CM | POA: Diagnosis not present

## 2020-07-21 DIAGNOSIS — Z8632 Personal history of gestational diabetes: Secondary | ICD-10-CM

## 2020-07-21 DIAGNOSIS — R768 Other specified abnormal immunological findings in serum: Secondary | ICD-10-CM

## 2020-07-21 DIAGNOSIS — O403XX0 Polyhydramnios, third trimester, not applicable or unspecified: Secondary | ICD-10-CM

## 2020-07-21 DIAGNOSIS — Z3A28 28 weeks gestation of pregnancy: Secondary | ICD-10-CM

## 2020-07-21 NOTE — Progress Notes (Signed)
   PRENATAL VISIT NOTE  Subjective:  Erin Matthews is a 30 y.o. G2P1001 at [redacted]w[redacted]d being seen today for ongoing prenatal care.  She is currently monitored for the following issues for this high-risk pregnancy and has Marijuana use; Encounter for supervision of normal pregnancy in multigravida in first trimester; History of gestational diabetes; Biological false positive RPR test; and Polyhydramnios on their problem list.  Patient reports no complaints.  Contractions: Not present. Vag. Bleeding: None.  Movement: Present. Denies leaking of fluid.   The following portions of the patient's history were reviewed and updated as appropriate: allergies, current medications, past family history, past medical history, past social history, past surgical history and problem list.   Objective:   Vitals:   07/21/20 0927  BP: 114/68  Pulse: 73  Weight: 133 lb (60.3 kg)    Fetal Status: Fetal Heart Rate (bpm): 145   Movement: Present     General:  Alert, oriented and cooperative. Patient is in no acute distress.  Skin: Skin is warm and dry. No rash noted.   Cardiovascular: Normal heart rate noted  Respiratory: Normal respiratory effort, no problems with respiration noted  Abdomen: Soft, gravid, appropriate for gestational age.  Pain/Pressure: Present     Pelvic: Cervical exam deferred        Extremities: Normal range of motion.     Mental Status: Normal mood and affect. Normal behavior. Normal judgment and thought content.   Assessment and Plan:  Pregnancy: G2P1001 at [redacted]w[redacted]d  1. History of gestational diabetes Diet controlled  2. Biological false positive RPR test  3. Encounter for supervision of normal pregnancy in multigravida in first trimester - Glucose Tolerance, 2 Hours w/1 Hour - CBC - HIV Antibody (routine testing w rflx) - RPR - Tdap today  4. [redacted] weeks gestation of pregnancy   Preterm labor symptoms and general obstetric precautions including but not limited to vaginal  bleeding, contractions, leaking of fluid and fetal movement were reviewed in detail with the patient. Please refer to After Visit Summary for other counseling recommendations.   Return in about 2 weeks (around 08/04/2020) for high OB, in person.  Future Appointments  Date Time Provider Department Center  08/11/2020  9:15 AM [facility] NURSE [facility]  08/11/2020  9:30 AM [facility] US3 [facility]    Conan Bowens, MD

## 2020-07-21 NOTE — Progress Notes (Signed)
Pt is doing well, declines Tdap today - advised can give anytime in this trimester.

## 2020-07-22 ENCOUNTER — Encounter: Payer: Self-pay | Admitting: Family Medicine

## 2020-07-22 ENCOUNTER — Telehealth: Payer: Self-pay

## 2020-07-22 DIAGNOSIS — O24419 Gestational diabetes mellitus in pregnancy, unspecified control: Secondary | ICD-10-CM

## 2020-07-22 LAB — CBC
Hematocrit: 34.7 % (ref 34.0–46.6)
Hemoglobin: 11.9 g/dL (ref 11.1–15.9)
MCH: 32.6 pg (ref 26.6–33.0)
MCHC: 34.3 g/dL (ref 31.5–35.7)
MCV: 95 fL (ref 79–97)
Platelets: 219 10*3/uL (ref 150–450)
RBC: 3.65 x10E6/uL — ABNORMAL LOW (ref 3.77–5.28)
RDW: 11.6 % — ABNORMAL LOW (ref 11.7–15.4)
WBC: 7.7 10*3/uL (ref 3.4–10.8)

## 2020-07-22 LAB — GLUCOSE TOLERANCE, 2 HOURS W/ 1HR
Glucose, 1 hour: 182 mg/dL — ABNORMAL HIGH (ref 65–179)
Glucose, 2 hour: 151 mg/dL (ref 65–152)
Glucose, Fasting: 81 mg/dL (ref 65–91)

## 2020-07-22 LAB — HIV ANTIBODY (ROUTINE TESTING W REFLEX): HIV Screen 4th Generation wRfx: NONREACTIVE

## 2020-07-22 LAB — RPR: RPR Ser Ql: NONREACTIVE

## 2020-07-22 MED ORDER — ACCU-CHEK GUIDE VI STRP: ORAL_STRIP | 12 refills | Status: AC

## 2020-07-22 MED ORDER — ACCU-CHEK AVIVA PLUS W/DEVICE KIT: 1.0000 | PACK | Freq: Once | 0 refills | Status: DC

## 2020-07-22 MED ORDER — ACCU-CHEK SOFTCLIX LANCETS MISC: 1 refills | Status: AC

## 2020-07-22 NOTE — Telephone Encounter (Signed)
Call patient to inform her of test results. No answer or voice mail. 

## 2020-07-22 NOTE — Telephone Encounter (Signed)
Patient now has Gest. Diabetes affecting her pregnancy. Referral placed in chart. Testing supplies call into her pharmacy.

## 2020-07-22 NOTE — Telephone Encounter (Signed)
-----   Message from Reva Bores, MD sent at 07/22/2020  7:37 AM EDT ----- Has GDM, please refer to D and N mgmt asap

## 2020-07-23 ENCOUNTER — Encounter: Payer: Self-pay | Admitting: Registered"

## 2020-07-23 ENCOUNTER — Other Ambulatory Visit: Payer: Self-pay

## 2020-07-23 ENCOUNTER — Encounter: Payer: Medicaid Other | Attending: Family Medicine | Admitting: Registered"

## 2020-07-23 DIAGNOSIS — O24419 Gestational diabetes mellitus in pregnancy, unspecified control: Secondary | ICD-10-CM | POA: Diagnosis not present

## 2020-07-23 NOTE — Progress Notes (Signed)
Patient was seen on 07/23/2020 for Gestational Diabetes self-management class at [facility]. The following learning objectives were met by the patient during this course:   States the definition of Gestational Diabetes  States why dietary management is important in controlling blood glucose  Describes the effects each nutrient has on blood glucose levels  Demonstrates ability to create a balanced meal plan  Demonstrates carbohydrate counting   States when to check blood glucose levels  Demonstrates proper blood glucose monitoring techniques  States the effect of stress and exercise on blood glucose levels  States the importance of limiting caffeine and abstaining from alcohol and smoking  Blood glucose monitor given: Patient has meter and is checking blood sugar prior to class  Patient instructed to monitor glucose levels: FBS: 60 - <95; 1 hour: <140; 2 hour: <120  Patient received handouts:  Nutrition Diabetes and Pregnancy, including carb counting list  Patient will be seen for follow-up as needed. 

## 2020-08-04 ENCOUNTER — Ambulatory Visit (INDEPENDENT_AMBULATORY_CARE_PROVIDER_SITE_OTHER): Payer: Medicaid Other | Admitting: Obstetrics & Gynecology

## 2020-08-04 ENCOUNTER — Other Ambulatory Visit: Payer: Self-pay

## 2020-08-04 VITALS — BP 99/64 | HR 79 | Wt 132.0 lb

## 2020-08-04 DIAGNOSIS — O24419 Gestational diabetes mellitus in pregnancy, unspecified control: Secondary | ICD-10-CM

## 2020-08-04 DIAGNOSIS — Z3481 Encounter for supervision of other normal pregnancy, first trimester: Secondary | ICD-10-CM

## 2020-08-04 NOTE — Progress Notes (Signed)
   PRENATAL VISIT NOTE  Subjective:  Erin Matthews is a 30 y.o. G2P1001 at [redacted]w[redacted]d being seen today for ongoing prenatal care.  She is currently monitored for the following issues for this high-risk pregnancy and has Marijuana use; Gestational diabetes; Encounter for supervision of normal pregnancy in multigravida in first trimester; History of gestational diabetes; Biological false positive RPR test; and Polyhydramnios on their problem list.  Patient reports no complaints.  Contractions: Not present. Vag. Bleeding: None.  Movement: Present. Denies leaking of fluid.   The following portions of the patient's history were reviewed and updated as appropriate: allergies, current medications, past family history, past medical history, past social history, past surgical history and problem list.   Objective:   Vitals:   08/04/20 0950  BP: 99/64  Pulse: 79  Weight: 132 lb (59.9 kg)    Fetal Status: Fetal Heart Rate (bpm): 144   Movement: Present     General:  Alert, oriented and cooperative. Patient is in no acute distress.  Skin: Skin is warm and dry. No rash noted.   Cardiovascular: Normal heart rate noted  Respiratory: Normal respiratory effort, no problems with respiration noted  Abdomen: Soft, gravid, appropriate for gestational age.  Pain/Pressure: Present     Pelvic: Cervical exam deferred        Extremities: Normal range of motion.  Edema: None  Mental Status: Normal mood and affect. Normal behavior. Normal judgment and thought content.   Assessment and Plan:  Pregnancy: G2P1001 at [redacted]w[redacted]d 1. Encounter for supervision of normal pregnancy in multigravida in first trimester Has f/u US 6/27  2. Gestational diabetes mellitus (GDM), antepartum, gestational diabetes method of control unspecified FBS <95, PP <120 reported  Preterm labor symptoms and general obstetric precautions including but not limited to vaginal bleeding, contractions, leaking of fluid and fetal movement were  reviewed in detail with the patient. Please refer to After Visit Summary for other counseling recommendations.   Return in about 2 weeks (around 08/18/2020).  Future Appointments  Date Time Provider Department Center  08/11/2020  9:15 AM [facility] NURSE [facility]  08/11/2020  9:30 AM [facility] US3 [facility]    Scheryl Darter, MD

## 2020-08-11 ENCOUNTER — Ambulatory Visit: Payer: Medicaid Other | Admitting: *Deleted

## 2020-08-11 ENCOUNTER — Encounter: Payer: Self-pay | Admitting: *Deleted

## 2020-08-11 ENCOUNTER — Other Ambulatory Visit: Payer: Self-pay | Admitting: *Deleted

## 2020-08-11 ENCOUNTER — Ambulatory Visit: Payer: Medicaid Other | Attending: Obstetrics

## 2020-08-11 ENCOUNTER — Other Ambulatory Visit: Payer: Self-pay

## 2020-08-11 VITALS — BP 108/57 | HR 73

## 2020-08-11 DIAGNOSIS — Z8632 Personal history of gestational diabetes: Secondary | ICD-10-CM | POA: Diagnosis present

## 2020-08-11 DIAGNOSIS — O283 Abnormal ultrasonic finding on antenatal screening of mother: Secondary | ICD-10-CM | POA: Diagnosis not present

## 2020-08-11 DIAGNOSIS — Z3A31 31 weeks gestation of pregnancy: Secondary | ICD-10-CM | POA: Diagnosis not present

## 2020-08-11 DIAGNOSIS — Z3481 Encounter for supervision of other normal pregnancy, first trimester: Secondary | ICD-10-CM | POA: Insufficient documentation

## 2020-08-11 DIAGNOSIS — R768 Other specified abnormal immunological findings in serum: Secondary | ICD-10-CM | POA: Insufficient documentation

## 2020-08-11 DIAGNOSIS — O403XX0 Polyhydramnios, third trimester, not applicable or unspecified: Secondary | ICD-10-CM | POA: Insufficient documentation

## 2020-08-11 DIAGNOSIS — Z362 Encounter for other antenatal screening follow-up: Secondary | ICD-10-CM

## 2020-08-11 DIAGNOSIS — O358XX0 Maternal care for other (suspected) fetal abnormality and damage, not applicable or unspecified: Secondary | ICD-10-CM

## 2020-08-11 DIAGNOSIS — O24410 Gestational diabetes mellitus in pregnancy, diet controlled: Secondary | ICD-10-CM

## 2020-08-21 ENCOUNTER — Encounter: Payer: Medicaid Other | Admitting: Obstetrics & Gynecology

## 2020-08-21 ENCOUNTER — Other Ambulatory Visit: Payer: Self-pay | Admitting: Obstetrics & Gynecology

## 2020-08-21 ENCOUNTER — Ambulatory Visit (INDEPENDENT_AMBULATORY_CARE_PROVIDER_SITE_OTHER): Payer: Medicaid Other | Admitting: Obstetrics & Gynecology

## 2020-08-21 ENCOUNTER — Encounter: Payer: Self-pay | Admitting: Obstetrics & Gynecology

## 2020-08-21 VITALS — BP 111/68 | HR 71 | Wt 129.0 lb

## 2020-08-21 DIAGNOSIS — Z3481 Encounter for supervision of other normal pregnancy, first trimester: Secondary | ICD-10-CM

## 2020-08-21 DIAGNOSIS — O24410 Gestational diabetes mellitus in pregnancy, diet controlled: Secondary | ICD-10-CM

## 2020-08-21 MED ORDER — ACCU-CHEK AVIVA PLUS W/DEVICE KIT: 1.0000 | PACK | Freq: Once | 0 refills | Status: AC

## 2020-08-21 NOTE — Progress Notes (Signed)
   PRENATAL VISIT NOTE  Subjective:  Erin Matthews is a 30 y.o. G2P1001 at [redacted]w[redacted]d being seen today for ongoing prenatal care.  She is currently monitored for the following issues for this high-risk pregnancy and has Marijuana use; Gestational diabetes; Encounter for supervision of normal pregnancy in multigravida in first trimester; History of gestational diabetes; Biological false positive RPR test; and Polyhydramnios on their problem list.  Patient reports no complaints.  Contractions: Not present. Vag. Bleeding: None.  Movement: Present. Denies leaking of fluid.   The following portions of the patient's history were reviewed and updated as appropriate: allergies, current medications, past family history, past medical history, past social history, past surgical history and problem list.   Objective:   Vitals:   08/21/20 1508  BP: 111/68  Pulse: 71  Weight: 129 lb (58.5 kg)    Fetal Status: Fetal Heart Rate (bpm): 145   Movement: Present     General:  Alert, oriented and cooperative. Patient is in no acute distress.  Skin: Skin is warm and dry. No rash noted.   Cardiovascular: Normal heart rate noted  Respiratory: Normal respiratory effort, no problems with respiration noted  Abdomen: Soft, gravid, appropriate for gestational age.  Pain/Pressure: Present     Pelvic: Cervical exam deferred        Extremities: Normal range of motion.  Edema: None  Mental Status: Normal mood and affect. Normal behavior. Normal judgment and thought content.   Assessment and Plan:  Pregnancy: G2P1001 at [redacted]w[redacted]d 1. Encounter for supervision of normal pregnancy in multigravida in first trimester F/u US in 2 weeks  2. Diet controlled gestational diabetes mellitus (GDM), antepartum FBS <90 nd PP<120 - Blood Glucose Monitoring Suppl (ACCU-CHEK AVIVA PLUS) w/Device KIT; 1 Device by Does not apply route once for 1 dose.  Dispense: 1 kit; Refill: 0  Preterm labor symptoms and general obstetric  precautions including but not limited to vaginal bleeding, contractions, leaking of fluid and fetal movement were reviewed in detail with the patient. Please refer to After Visit Summary for other counseling recommendations.   Return in about 2 weeks (around 09/04/2020).  Future Appointments  Date Time Provider [place]  09/04/2020 10:15 AM Woodroe Mode, MD [place] None  09/08/2020 10:45 AM [facility] NURSE [facility]  09/08/2020 11:00 AM [facility] US1 [facility]    Emeterio Reeve, MD

## 2020-08-21 NOTE — Progress Notes (Signed)
Pt reports fetal movement, denies pain. Pt has not checked BG in 3 days because she lost meter.

## 2020-09-04 ENCOUNTER — Encounter: Payer: Medicaid Other | Admitting: Obstetrics & Gynecology

## 2020-09-08 ENCOUNTER — Ambulatory Visit: Payer: Medicaid Other | Admitting: *Deleted

## 2020-09-08 ENCOUNTER — Other Ambulatory Visit: Payer: Self-pay

## 2020-09-08 ENCOUNTER — Ambulatory Visit: Payer: Medicaid Other | Attending: Obstetrics

## 2020-09-08 ENCOUNTER — Ambulatory Visit (INDEPENDENT_AMBULATORY_CARE_PROVIDER_SITE_OTHER): Payer: Medicaid Other | Admitting: Obstetrics

## 2020-09-08 ENCOUNTER — Encounter: Payer: Self-pay | Admitting: Obstetrics

## 2020-09-08 VITALS — BP 106/67 | HR 82 | Wt 137.1 lb

## 2020-09-08 VITALS — BP 116/73 | HR 68

## 2020-09-08 DIAGNOSIS — Z8632 Personal history of gestational diabetes: Secondary | ICD-10-CM

## 2020-09-08 DIAGNOSIS — O24410 Gestational diabetes mellitus in pregnancy, diet controlled: Secondary | ICD-10-CM

## 2020-09-08 DIAGNOSIS — O403XX0 Polyhydramnios, third trimester, not applicable or unspecified: Secondary | ICD-10-CM

## 2020-09-08 DIAGNOSIS — Z3481 Encounter for supervision of other normal pregnancy, first trimester: Secondary | ICD-10-CM | POA: Insufficient documentation

## 2020-09-08 DIAGNOSIS — R768 Other specified abnormal immunological findings in serum: Secondary | ICD-10-CM | POA: Diagnosis present

## 2020-09-08 DIAGNOSIS — Z362 Encounter for other antenatal screening follow-up: Secondary | ICD-10-CM | POA: Diagnosis not present

## 2020-09-08 DIAGNOSIS — O358XX0 Maternal care for other (suspected) fetal abnormality and damage, not applicable or unspecified: Secondary | ICD-10-CM

## 2020-09-08 DIAGNOSIS — Z3A35 35 weeks gestation of pregnancy: Secondary | ICD-10-CM

## 2020-09-08 DIAGNOSIS — O0990 Supervision of high risk pregnancy, unspecified, unspecified trimester: Secondary | ICD-10-CM

## 2020-09-08 NOTE — Progress Notes (Signed)
Subjective:  Erin Matthews is a 30 y.o. G2P1001 at 6936w2d being seen today for ongoing prenatal care.  She is currently monitored for the following issues for this low-risk pregnancy and has Marijuana use; Gestational diabetes; Encounter for supervision of normal pregnancy in multigravida in first trimester; History of gestational diabetes; Biological false positive RPR test; and Polyhydramnios on their problem list.  Patient reports backache.  Contractions: Irritability. Vag. Bleeding: None.  Movement: Present. Denies leaking of fluid.   The following portions of the patient's history were reviewed and updated as appropriate: allergies, current medications, past family history, past medical history, past social history, past surgical history and problem list. Problem list updated.  Objective:   Vitals:   09/08/20 1508  BP: 106/67  Pulse: 82  Weight: 137 lb 1.6 oz (62.2 kg)    Fetal Status:     Movement: Present     General:  Alert, oriented and cooperative. Patient is in no acute distress.  Skin: Skin is warm and dry. No rash noted.   Cardiovascular: Normal heart rate noted  Respiratory: Normal respiratory effort, no problems with respiration noted  Abdomen: Soft, gravid, appropriate for gestational age. Pain/Pressure: Present     Pelvic:  Cervical exam deferred        Extremities: Normal range of motion.  Edema: None  Mental Status: Normal mood and affect. Normal behavior. Normal judgment and thought content.   Urinalysis:     Ultrasound:   US MFM OB FOLLOW UP (Accession [id number]) (Order [id number]) Imaging Date: 09/08/2020 [facility] Released By: Julienne Kassarter, Marleda A Authorizing: Barton DuboisFang, Yu Ming Victor, MD      Exam Status  Status  Final [99]    PACS Intelerad Image Link   Show images for US MFM OB FOLLOW UP  Study Result  Narrative & Impression   ----------------------------------------------------------------------  OBSTETRICS REPORT                       (Signed Final 09/08/2020 11:46 am) ---------------------------------------------------------------------- Patient Info  ID #:       [id number]                          D.O.B.:  11-26-1990 (29 yrs)  Name:       Erin Matthews              Visit Date: 09/08/2020 10:55 am ---------------------------------------------------------------------- Performed By  Attending:        Noralee Spaceavi Shankar MD        Ref. Address:     Faculty  Performed By:     Sandi MealyJovancia Adrien        [facility]  Referred By:      Catalina Antigua MD ---------------------------------------------------------------------- Orders  #  Description                           Code        Ordered By  1  [place] MFM OB FOLLOW UP                   82993.71    Rosana Hoes ----------------------------------------------------------------------  #  Order #                     Accession #                Episode #  1  [id number]                   [id number]                 [id number] ---------------------------------------------------------------------- Indications  Gestational diabetes in pregnancy, diet        O24.410  controlled  Polyhydramnios, third trimester, antepartum    O40.3XX0  condition or complication, unspecified fetus  Echogenic intracardiac focus of the heart      O35.8XX0  (EIF)  Poor obstetric history: Previous gestational   O09.299  diabetes  Encounter for other antenatal screening        Z36.2  follow-up  [redacted] weeks gestation of pregnancy                Z3A.35  LR NIPS/ Negative Horizon ---------------------------------------------------------------------- Fetal  Evaluation  Num Of Fetuses:         1  Fetal Heart Rate(bpm):  126  Cardiac Activity:       Observed  Presentation:           Cephalic  Placenta:               Posterior  P. Cord Insertion:      Visualized  Amniotic Fluid  AFI FV:      Polyhydramnios  AFI Sum(cm)     %Tile       Largest Pocket(cm)  25.42           95          8.14  RUQ(cm)       RLQ(cm)       LUQ(cm)        LLQ(cm)  8.14          6.49          7.36           3.43 ---------------------------------------------------------------------- Biometry  BPD:      92.8  mm     G. Age:  37w 5d         97  %    CI:         76.8   %    70 - 86  FL/HC:      19.6   %    20.1 - 22.3  HC:      335.4  mm     G. Age:  38w 3d         88  %    HC/AC:      1.06        0.93 - 1.11  AC:      317.5  mm     G. Age:  35w 5d         69  %    FL/BPD:     70.8   %    71 - 87  FL:       65.7  mm     G. Age:  33w 6d         12  %    FL/AC:      20.7   %    20 - 24  HUM:      58.3  mm     G. Age:  33w 5d         37  %  LV:        2.2  mm  Est. FW:    2743  gm      6 lb 1 oz     60  % ---------------------------------------------------------------------- OB History  Blood Type:   O+  Gravidity:    2         Term:   1        Prem:   0        SAB:   0  TOP:          0       Ectopic:  0        Living: 1 ---------------------------------------------------------------------- Gestational Age  LMP:           35w 2d        Date:  01/05/20                 EDD:   10/11/20  U/S Today:     36w 3d                                        EDD:   10/03/20  Best:          35w 2d     Det. By:  U/S C R L  (03/13/20)    EDD:   10/11/20 ---------------------------------------------------------------------- Anatomy  Cranium:               Appears normal         LVOT:                   Previously seen  Cavum:                 Previously seen        Aortic Arch:            Previously seen  Ventricles:             Appears normal         Ductal Arch:            Previously seen  Choroid Plexus:        Previously seen        Diaphragm:  Appears normal  Cerebellum:            Previously seen        Stomach:                Appears normal, left                                                                        sided  Posterior Fossa:       Previously seen        Abdomen:                Previously seen  Nuchal Fold:           Not applicable (>20    Abdominal Wall:         Previously seen                         wks GA)  Face:                  Orbits and profile     Cord Vessels:           Previously seen                         previously seen  Lips:                  Previously seen        Kidneys:                Appear normal  Palate:                Previously seen        Bladder:                Appears normal  Thoracic:              Appears normal         Spine:                  Previously seen  Heart:                 Previously seen        Upper Extremities:      Previously seen  RVOT:                  Previously seen        Lower Extremities:      Previously seen ---------------------------------------------------------------------- Impression  Patient has a diagnosis of gestational diabetes that is well  controlled on diet.  Her blood pressure today at her office is  116/73 mmHg.  Mild polyhydramnios is seen (AFI 25 cm).  Good fetal activity  is present.Fetal growth is appropriate for gestational age .  Explained the findings.  Patient reports that she has been  checking her blood glucose regularly and that they are within  normal range. ---------------------------------------------------------------------- Recommendations  - Consider delivery at [redacted] weeks gestation or earlier if  diabetes is not well controlled.  -Weekly BPP if patient requires metformin or insulin. ----------------------------------------------------------------------  Noralee Space,  MD Electronically Signed Final Report   09/08/2020 11:46 am ----------------------------------------------------------------------      Assessment and Plan:  Pregnancy: G2P1001 at [redacted]w[redacted]d  1. Supervision of high risk pregnancy, antepartum  2. Diet controlled gestational diabetes mellitus (GDM), antepartum - blood glucose log:  FBS' , < 95 mg/dl                                  2 hour PP:  < 120 mg/dl  3. Polyhydramnios In third trimester complication, single or unspecified fetus   Mild ( AFI = 25 cm ) - delivery at 39 weeks if continued good glucose control, per MFM    There are no diagnoses linked to this encounter. Preterm labor symptoms and general obstetric precautions including but not limited to vaginal bleeding, contractions, leaking of fluid and fetal movement were reviewed in detail with the patient. Please refer to After Visit Summary for other counseling recommendations.   Return in about 1 week (around 09/15/2020) for ROB.   Brock Bad, MD  09/08/20

## 2020-09-08 NOTE — Progress Notes (Signed)
Patient presents for ROB. Patient has no concerns today. 

## 2020-09-15 ENCOUNTER — Encounter: Payer: Medicaid Other | Admitting: Obstetrics

## 2020-09-16 ENCOUNTER — Other Ambulatory Visit (HOSPITAL_COMMUNITY)
Admission: RE | Admit: 2020-09-16 | Discharge: 2020-09-16 | Disposition: A | Discharge status: Home / Self Care | Payer: Medicaid Other | Source: Ambulatory Visit | Attending: Obstetrics | Admitting: Obstetrics

## 2020-09-16 ENCOUNTER — Other Ambulatory Visit: Payer: Self-pay

## 2020-09-16 ENCOUNTER — Ambulatory Visit (INDEPENDENT_AMBULATORY_CARE_PROVIDER_SITE_OTHER): Payer: Medicaid Other | Admitting: Obstetrics and Gynecology

## 2020-09-16 VITALS — BP 104/68 | HR 83 | Wt 138.0 lb

## 2020-09-16 DIAGNOSIS — O24410 Gestational diabetes mellitus in pregnancy, diet controlled: Secondary | ICD-10-CM

## 2020-09-16 DIAGNOSIS — R768 Other specified abnormal immunological findings in serum: Secondary | ICD-10-CM

## 2020-09-16 DIAGNOSIS — Z3481 Encounter for supervision of other normal pregnancy, first trimester: Secondary | ICD-10-CM | POA: Diagnosis present

## 2020-09-16 DIAGNOSIS — R7689 Other specified abnormal immunological findings in serum: Secondary | ICD-10-CM

## 2020-09-16 DIAGNOSIS — O403XX0 Polyhydramnios, third trimester, not applicable or unspecified: Secondary | ICD-10-CM

## 2020-09-16 DIAGNOSIS — Z3A36 36 weeks gestation of pregnancy: Secondary | ICD-10-CM | POA: Insufficient documentation

## 2020-09-16 DIAGNOSIS — Z8632 Personal history of gestational diabetes: Secondary | ICD-10-CM

## 2020-09-16 NOTE — Progress Notes (Signed)
ROB/GBS.  C/o intermittent back pain 7/10 x 2 weeks.

## 2020-09-16 NOTE — Progress Notes (Signed)
   PRENATAL VISIT NOTE  Subjective:  Erin Matthews is a 30 y.o. G2P1001 at [redacted]w[redacted]d being seen today for ongoing prenatal care.  She is currently monitored for the following issues for this high-risk pregnancy and has Marijuana use; Gestational diabetes; Encounter for supervision of normal pregnancy in multigravida in first trimester; History of gestational diabetes; Biological false positive RPR test; Polyhydramnios; and [redacted] weeks gestation of pregnancy on their problem list.  Patient doing well with no acute concerns today. She reports no complaints.  Contractions: Not present. Vag. Bleeding: None.  Movement: Present. Denies leaking of fluid.   The following portions of the patient's history were reviewed and updated as appropriate: allergies, current medications, past family history, past medical history, past social history, past surgical history and problem list. Problem list updated.  Objective:   Vitals:   09/16/20 1344  BP: 104/68  Pulse: 83  Weight: 138 lb (62.6 kg)    Fetal Status: Fetal Heart Rate (bpm): 144 Fundal Height: 36 cm Movement: Present     General:  Alert, oriented and cooperative. Patient is in no acute distress.  Skin: Skin is warm and dry. No rash noted.   Cardiovascular: Normal heart rate noted  Respiratory: Normal respiratory effort, no problems with respiration noted  Abdomen: Soft, gravid, appropriate for gestational age.  Pain/Pressure: Present     Pelvic: Cervical exam performed Dilation: Fingertip Effacement (%): Thick, 50 Station: -2  Extremities: Normal range of motion.  Edema: None  Mental Status:  Normal mood and affect. Normal behavior. Normal judgment and thought content.   Assessment and Plan:  Pregnancy: G2P1001 at [redacted]w[redacted]d  1. Encounter for supervision of normal pregnancy in multigravida in first trimester Continue routine care, schedule IOL at next visit Information given on long term contraception, ( IUD, nexplanon) - Strep Gp B NAA -  Cervicovaginal ancillary only( [place])  2. Diet controlled gestational diabetes mellitus (GDM), antepartum Pt did not bring blood sugars, states FBS: 92 PPBS: 113, pt to bring blood sugars next visit  3. Polyhydramnios in third trimester complication, single or unspecified fetus AFI 25 on last check, per MFM pt can go to 39 weeks  4. History of gestational diabetes   5. Biological false positive RPR test   6. [redacted] weeks gestation of pregnancy   Preterm labor symptoms and general obstetric precautions including but not limited to vaginal bleeding, contractions, leaking of fluid and fetal movement were reviewed in detail with the patient.  Please refer to After Visit Summary for other counseling recommendations.   Return in about 1 week (around 09/23/2020) for [facility], in person.   Mariel Aloe, MD Faculty Attending [facility]

## 2020-09-17 LAB — CERVICOVAGINAL ANCILLARY ONLY
Chlamydia: NEGATIVE
Comment: NEGATIVE
Comment: NORMAL
Neisseria Gonorrhea: NEGATIVE

## 2020-09-18 ENCOUNTER — Encounter: Payer: Self-pay | Admitting: Obstetrics and Gynecology

## 2020-09-18 DIAGNOSIS — O99820 Streptococcus B carrier state complicating pregnancy: Secondary | ICD-10-CM | POA: Insufficient documentation

## 2020-09-18 LAB — STREP GP B NAA: Strep Gp B NAA: POSITIVE — AB

## 2020-09-24 ENCOUNTER — Encounter: Payer: Medicaid Other | Admitting: Obstetrics and Gynecology

## 2020-09-29 ENCOUNTER — Telehealth (HOSPITAL_COMMUNITY): Payer: Self-pay | Admitting: *Deleted

## 2020-09-29 ENCOUNTER — Other Ambulatory Visit: Payer: Self-pay

## 2020-09-29 ENCOUNTER — Encounter (HOSPITAL_COMMUNITY): Payer: Self-pay | Admitting: *Deleted

## 2020-09-29 ENCOUNTER — Ambulatory Visit (INDEPENDENT_AMBULATORY_CARE_PROVIDER_SITE_OTHER): Payer: Medicaid Other | Admitting: Obstetrics and Gynecology

## 2020-09-29 ENCOUNTER — Encounter (HOSPITAL_COMMUNITY): Payer: Self-pay

## 2020-09-29 VITALS — BP 103/66 | HR 92 | Wt 139.0 lb

## 2020-09-29 DIAGNOSIS — O99820 Streptococcus B carrier state complicating pregnancy: Secondary | ICD-10-CM

## 2020-09-29 DIAGNOSIS — R768 Other specified abnormal immunological findings in serum: Secondary | ICD-10-CM

## 2020-09-29 DIAGNOSIS — Z3A38 38 weeks gestation of pregnancy: Secondary | ICD-10-CM | POA: Insufficient documentation

## 2020-09-29 DIAGNOSIS — O24410 Gestational diabetes mellitus in pregnancy, diet controlled: Secondary | ICD-10-CM

## 2020-09-29 NOTE — Progress Notes (Signed)
ROB 38.2 wks GDM, didn't bring log Not taking ASA or PNV. Declines SVE today.

## 2020-09-29 NOTE — Progress Notes (Signed)
   PRENATAL VISIT NOTE  Subjective:  Erin Matthews is a 30 y.o. G2P1001 at [redacted]w[redacted]d being seen today for ongoing prenatal care.  She is currently monitored for the following issues for this high-risk pregnancy and has Marijuana use; Gestational diabetes; Encounter for supervision of normal pregnancy in multigravida in first trimester; History of gestational diabetes; Biological false positive RPR test; Polyhydramnios; [redacted] weeks gestation of pregnancy; Group B Streptococcus carrier, +RV culture, currently pregnant; and [redacted] weeks gestation of pregnancy on their problem list.  Patient doing well with no acute concerns today. She reports no complaints.  Contractions: Irregular. Vag. Bleeding: None.  Movement: Present. Denies leaking of fluid.   The following portions of the patient's history were reviewed and updated as appropriate: allergies, current medications, past family history, past medical history, past social history, past surgical history and problem list. Problem list updated.  Objective:   Vitals:   09/29/20 1111  BP: 103/66  Pulse: 92  Weight: 139 lb (63 kg)    Fetal Status: Fetal Heart Rate (bpm): 132   Movement: Present     General:  Alert, oriented and cooperative. Patient is in no acute distress.  Skin: Skin is warm and dry. No rash noted.   Cardiovascular: Normal heart rate noted  Respiratory: Normal respiratory effort, no problems with respiration noted  Abdomen: Soft, gravid, appropriate for gestational age.  Pain/Pressure: Present     Pelvic: Cervical exam deferred        Extremities: Normal range of motion.  Edema: None  Mental Status:  Normal mood and affect. Normal behavior. Normal judgment and thought content.   Assessment and Plan:  Pregnancy: G2P1001 at [redacted]w[redacted]d  1. Diet controlled gestational diabetes mellitus (GDM) in third trimester FBS: 85 PPBS: 115 Pt did not bring blood sugars, hard to follow from history Last u/s pt had polyhydramnios AFI of 25,  recommended possible delivery at 39 weeks, IOL scheduled  2. [redacted] weeks gestation of pregnancy   3. Group B Streptococcus carrier, +RV culture, currently pregnant Treat in labor  4. Biological false positive RPR test   Term labor symptoms and general obstetric precautions including but not limited to vaginal bleeding, contractions, leaking of fluid and fetal movement were reviewed in detail with the patient.  Please refer to After Visit Summary for other counseling recommendations.   Will schedule IOL.   Mariel Aloe, MD Faculty Attending Center for [facility]

## 2020-09-29 NOTE — Addendum Note (Signed)
Addended by: Warden Fillers on: 09/29/2020 12:09 PM   Modules accepted: Orders, SmartSet

## 2020-09-29 NOTE — Telephone Encounter (Signed)
Preadmission screen  

## 2020-10-01 ENCOUNTER — Other Ambulatory Visit: Payer: Self-pay | Admitting: Advanced Practice Midwife

## 2020-10-02 ENCOUNTER — Other Ambulatory Visit: Payer: Self-pay | Admitting: Obstetrics and Gynecology

## 2020-10-03 LAB — SARS CORONAVIRUS 2 (TAT 6-24 HRS): SARS Coronavirus 2: NEGATIVE

## 2020-10-04 ENCOUNTER — Inpatient Hospital Stay (HOSPITAL_COMMUNITY): Payer: Medicaid Other | Admitting: Anesthesiology

## 2020-10-04 ENCOUNTER — Inpatient Hospital Stay (HOSPITAL_COMMUNITY)
Admission: AD | Admit: 2020-10-04 | Discharge: 2020-10-06 | DRG: 807 | Disposition: A | Discharge status: Home / Self Care | Payer: Medicaid Other | Attending: Obstetrics and Gynecology | Admitting: Obstetrics and Gynecology

## 2020-10-04 ENCOUNTER — Other Ambulatory Visit: Payer: Self-pay

## 2020-10-04 ENCOUNTER — Encounter (HOSPITAL_COMMUNITY): Payer: Self-pay | Admitting: Obstetrics and Gynecology

## 2020-10-04 ENCOUNTER — Inpatient Hospital Stay (HOSPITAL_COMMUNITY): Payer: Medicaid Other

## 2020-10-04 DIAGNOSIS — O403XX0 Polyhydramnios, third trimester, not applicable or unspecified: Secondary | ICD-10-CM | POA: Diagnosis present

## 2020-10-04 DIAGNOSIS — Z3A39 39 weeks gestation of pregnancy: Secondary | ICD-10-CM | POA: Diagnosis not present

## 2020-10-04 DIAGNOSIS — Z3481 Encounter for supervision of other normal pregnancy, first trimester: Secondary | ICD-10-CM

## 2020-10-04 DIAGNOSIS — R768 Other specified abnormal immunological findings in serum: Secondary | ICD-10-CM | POA: Diagnosis present

## 2020-10-04 DIAGNOSIS — O409XX0 Polyhydramnios, unspecified trimester, not applicable or unspecified: Secondary | ICD-10-CM | POA: Diagnosis present

## 2020-10-04 DIAGNOSIS — O99824 Streptococcus B carrier state complicating childbirth: Secondary | ICD-10-CM | POA: Diagnosis present

## 2020-10-04 DIAGNOSIS — O24420 Gestational diabetes mellitus in childbirth, diet controlled: Secondary | ICD-10-CM | POA: Diagnosis present

## 2020-10-04 DIAGNOSIS — Z8632 Personal history of gestational diabetes: Secondary | ICD-10-CM

## 2020-10-04 DIAGNOSIS — Z87891 Personal history of nicotine dependence: Secondary | ICD-10-CM | POA: Diagnosis not present

## 2020-10-04 DIAGNOSIS — O99820 Streptococcus B carrier state complicating pregnancy: Secondary | ICD-10-CM

## 2020-10-04 DIAGNOSIS — O24410 Gestational diabetes mellitus in pregnancy, diet controlled: Secondary | ICD-10-CM | POA: Diagnosis present

## 2020-10-04 DIAGNOSIS — O24419 Gestational diabetes mellitus in pregnancy, unspecified control: Secondary | ICD-10-CM | POA: Diagnosis present

## 2020-10-04 LAB — COMPREHENSIVE METABOLIC PANEL
ALT: 14 U/L (ref 0–44)
AST: 20 U/L (ref 15–41)
Albumin: 2.6 g/dL — ABNORMAL LOW (ref 3.5–5.0)
Alkaline Phosphatase: 271 U/L — ABNORMAL HIGH (ref 38–126)
Anion gap: 11 (ref 5–15)
BUN: 7 mg/dL (ref 6–20)
CO2: 19 mmol/L — ABNORMAL LOW (ref 22–32)
Calcium: 8.8 mg/dL — ABNORMAL LOW (ref 8.9–10.3)
Chloride: 108 mmol/L (ref 98–111)
Creatinine, Ser: 0.68 mg/dL (ref 0.44–1.00)
GFR, Estimated: >60 mL/min (ref >60)
Glucose, Bld: 126 mg/dL — ABNORMAL HIGH (ref 70–99)
Potassium: 3.7 mmol/L (ref 3.5–5.1)
Sodium: 138 mmol/L (ref 135–145)
Total Bilirubin: 0.5 mg/dL (ref 0.3–1.2)
Total Protein: 6.2 g/dL — ABNORMAL LOW (ref 6.5–8.1)

## 2020-10-04 LAB — CBC
HCT: 34 % — ABNORMAL LOW (ref 36.0–46.0)
Hemoglobin: 11.1 g/dL — ABNORMAL LOW (ref 12.0–15.0)
MCH: 31.5 pg (ref 26.0–34.0)
MCHC: 32.6 g/dL (ref 30.0–36.0)
MCV: 96.6 fL (ref 80.0–100.0)
Platelets: 201 10*3/uL (ref 150–400)
RBC: 3.52 MIL/uL — ABNORMAL LOW (ref 3.87–5.11)
RDW: 12.7 % (ref 11.5–15.5)
WBC: 6.2 10*3/uL (ref 4.0–10.5)
nRBC: 0 % (ref 0.0–0.2)

## 2020-10-04 LAB — RPR: RPR Ser Ql: NONREACTIVE

## 2020-10-04 LAB — GLUCOSE, CAPILLARY
Glucose-Capillary: 137 mg/dL — ABNORMAL HIGH (ref 70–99)
Glucose-Capillary: 127 mg/dL — ABNORMAL HIGH (ref 70–99)
Glucose-Capillary: 98 mg/dL (ref 70–99)
Glucose-Capillary: 74 mg/dL (ref 70–99)

## 2020-10-04 LAB — TYPE AND SCREEN
ABO/RH(D): O POS
Antibody Screen: NEGATIVE

## 2020-10-04 MED ORDER — OXYTOCIN-SODIUM CHLORIDE 30-0.9 UT/500ML-% IV SOLN
1.0000 m[IU]/min | INTRAVENOUS | Status: DC
  Administered 2020-10-04 – 2020-10-05 (×2): 2 m[IU]/min via INTRAVENOUS
  Filled 2020-10-04: qty 500

## 2020-10-04 MED ORDER — FENTANYL-BUPIVACAINE-NACL 0.5-0.125-0.9 MG/250ML-% EP SOLN
12.0000 mL/h | EPIDURAL | Status: DC | PRN
  Administered 2020-10-04: 12 mL/h via EPIDURAL

## 2020-10-04 MED ORDER — MISOPROSTOL 50MCG HALF TABLET
ORAL_TABLET | ORAL | Status: AC
  Administered 2020-10-04: 50 ug via ORAL
  Filled 2020-10-04: qty 1

## 2020-10-04 MED ORDER — LACTATED RINGERS IV SOLN: INTRAVENOUS | Status: DC

## 2020-10-04 MED ORDER — SOD CITRATE-CITRIC ACID 500-334 MG/5ML PO SOLN: 30.0000 mL | ORAL | Status: DC | PRN

## 2020-10-04 MED ORDER — LACTATED RINGERS IV SOLN: 500.0000 mL | Freq: Once | INTRAVENOUS | Status: DC

## 2020-10-04 MED ORDER — TERBUTALINE SULFATE 1 MG/ML IJ SOLN: 0.2500 mg | Freq: Once | INTRAMUSCULAR | Status: DC | PRN

## 2020-10-04 MED ORDER — OXYCODONE-ACETAMINOPHEN 5-325 MG PO TABS: 1.0000 | ORAL_TABLET | ORAL | Status: DC | PRN

## 2020-10-04 MED ORDER — PENICILLIN G POT IN DEXTROSE 60000 UNIT/ML IV SOLN
3.0000 10*6.[IU] | INTRAVENOUS | Status: DC
  Administered 2020-10-04 – 2020-10-05 (×4): 3 10*6.[IU] via INTRAVENOUS
  Filled 2020-10-04 (×4): qty 50

## 2020-10-04 MED ORDER — PHENYLEPHRINE 40 MCG/ML (10ML) SYRINGE FOR IV PUSH (FOR BLOOD PRESSURE SUPPORT)
80.0000 ug | PREFILLED_SYRINGE | INTRAVENOUS | Status: DC | PRN
  Filled 2020-10-04: qty 10

## 2020-10-04 MED ORDER — OXYTOCIN-SODIUM CHLORIDE 30-0.9 UT/500ML-% IV SOLN: 2.5000 [IU]/h | INTRAVENOUS | Status: DC

## 2020-10-04 MED ORDER — ONDANSETRON HCL 4 MG/2ML IJ SOLN
4.0000 mg | Freq: Four times a day (QID) | INTRAMUSCULAR | Status: DC | PRN
  Administered 2020-10-04: 4 mg via INTRAVENOUS
  Filled 2020-10-04: qty 2

## 2020-10-04 MED ORDER — FENTANYL-BUPIVACAINE-NACL 0.5-0.125-0.9 MG/250ML-% EP SOLN
EPIDURAL | Status: AC
  Filled 2020-10-04: qty 250

## 2020-10-04 MED ORDER — FENTANYL-BUPIVACAINE-NACL 0.5-0.125-0.9 MG/250ML-% EP SOLN: 12.0000 mL/h | EPIDURAL | Status: DC | PRN

## 2020-10-04 MED ORDER — EPHEDRINE 5 MG/ML INJ: 10.0000 mg | INTRAVENOUS | Status: DC | PRN

## 2020-10-04 MED ORDER — ACETAMINOPHEN 325 MG PO TABS
650.0000 mg | ORAL_TABLET | ORAL | Status: DC | PRN
  Administered 2020-10-05: 650 mg via ORAL
  Filled 2020-10-04: qty 2

## 2020-10-04 MED ORDER — LIDOCAINE HCL (PF) 1 % IJ SOLN
30.0000 mL | INTRAMUSCULAR | Status: AC | PRN
  Administered 2020-10-05: 30 mL via SUBCUTANEOUS
  Filled 2020-10-04: qty 30

## 2020-10-04 MED ORDER — DIPHENHYDRAMINE HCL 50 MG/ML IJ SOLN: 12.5000 mg | INTRAMUSCULAR | Status: DC | PRN

## 2020-10-04 MED ORDER — SODIUM CHLORIDE 0.9 % IV SOLN
5.0000 10*6.[IU] | Freq: Once | INTRAVENOUS | Status: AC
  Administered 2020-10-04: 5 10*6.[IU] via INTRAVENOUS
  Filled 2020-10-04: qty 5

## 2020-10-04 MED ORDER — LACTATED RINGERS IV SOLN: 500.0000 mL | INTRAVENOUS | Status: DC | PRN

## 2020-10-04 MED ORDER — MISOPROSTOL 50MCG HALF TABLET
50.0000 ug | ORAL_TABLET | ORAL | Status: DC
  Administered 2020-10-04: 50 ug via ORAL
  Filled 2020-10-04: qty 1

## 2020-10-04 MED ORDER — PHENYLEPHRINE 40 MCG/ML (10ML) SYRINGE FOR IV PUSH (FOR BLOOD PRESSURE SUPPORT): 80.0000 ug | PREFILLED_SYRINGE | INTRAVENOUS | Status: DC | PRN

## 2020-10-04 MED ORDER — FENTANYL CITRATE (PF) 100 MCG/2ML IJ SOLN
50.0000 ug | INTRAMUSCULAR | Status: DC | PRN
  Administered 2020-10-04: 100 ug via INTRAVENOUS
  Filled 2020-10-04: qty 2

## 2020-10-04 MED ORDER — OXYTOCIN BOLUS FROM INFUSION
333.0000 mL | Freq: Once | INTRAVENOUS | Status: AC
  Administered 2020-10-05: 333 mL via INTRAVENOUS

## 2020-10-04 MED ORDER — LIDOCAINE HCL (PF) 1 % IJ SOLN
INTRAMUSCULAR | Status: DC | PRN
  Administered 2020-10-04: 8 mL via EPIDURAL

## 2020-10-04 MED ORDER — MISOPROSTOL 25 MCG QUARTER TABLET: 25.0000 ug | ORAL_TABLET | ORAL | Status: DC | PRN

## 2020-10-04 NOTE — Progress Notes (Signed)
Labor Progress Note Erin Matthews is a 30 y.o. G2P1001 at [redacted]w[redacted]d presented for IOL for A1GDM.   S: RN reports she has been feeling more contractions.   O:  BP 109/66   Pulse 65   Temp 97.7 F (36.5 C) (Axillary)   Resp 16   Ht 5\' 1"  (1.549 m)   Wt 62.9 kg   LMP 01/05/2020   BMI 26.19 kg/m  EFM: 120/mod/15x15/no decel  CVE: Dilation: 3 Effacement (%): 50 Cervical Position: Posterior Station: -3 Presentation: Vertex Exam by:: [email or web address] RN   A&P: 29 y.o. G2P1001 [redacted]w[redacted]d. #Labor: Progressing well, S/p cytotec x2. Will start pit 2x2, anticipate likely AROM on next check if favorable.  #Pain: Plans for epidural  #FWB: Cat 1 #GBS positive (PCN)   #A1GDM:  Most recent CBG 98. Cont to monitor q4.   [redacted]w[redacted]d, DO 7:11 PM

## 2020-10-04 NOTE — Anesthesia Preprocedure Evaluation (Signed)
Anesthesia Evaluation  Patient identified by MRN, date of birth, ID band Patient awake    Reviewed: Allergy & Precautions, H&P , NPO status , Patient's Chart, lab work & pertinent test results, reviewed documented beta blocker date and time   Airway Mallampati: I  TM Distance: >3 FB Neck ROM: full    Dental no notable dental hx. (+) Teeth Intact, Dental Advisory Given   Pulmonary neg pulmonary ROS, former smoker,    Pulmonary exam normal breath sounds clear to auscultation       Cardiovascular negative cardio ROS Normal cardiovascular exam Rhythm:regular Rate:Normal     Neuro/Psych negative neurological ROS  negative psych ROS   GI/Hepatic negative GI ROS, Neg liver ROS,   Endo/Other  diabetes, Gestational  Renal/GU negative Renal ROS  negative genitourinary   Musculoskeletal   Abdominal   Peds  Hematology negative hematology ROS (+)   Anesthesia Other Findings   Reproductive/Obstetrics (+) Pregnancy                             Anesthesia Physical Anesthesia Plan  ASA: 2  Anesthesia Plan: Epidural   Post-op Pain Management:    Induction:   PONV Risk Score and Plan:   Airway Management Planned: Natural Airway  Additional Equipment: None  Intra-op Plan:   Post-operative Plan:   Informed Consent: I have reviewed the patients History and Physical, chart, labs and discussed the procedure including the risks, benefits and alternatives for the proposed anesthesia with the patient or authorized representative who has indicated his/her understanding and acceptance.       Plan Discussed with: Anesthesiologist and CRNA  Anesthesia Plan Comments:        Anesthesia Quick Evaluation

## 2020-10-04 NOTE — Progress Notes (Signed)
Labor Progress Note Erin Matthews is a 30 y.o. G2P1001 at [redacted]w[redacted]d presented for IOL for A1GDM.   S: Doing well, feeling contractions occasionally   O:  BP 112/73   Pulse 76   Temp 97.7 F (36.5 C) (Axillary)   Resp 16   Ht 5\' 1"  (1.549 m)   Wt 62.9 kg   LMP 01/05/2020   BMI 26.19 kg/m  EFM: 125/mod/15x15  CVE: Dilation: 1 Effacement (%): Thick Station: -3 Presentation: Vertex Exam by:: Dr [email or web address]   A&P: 29 y.o. G2P1001 [redacted]w[redacted]d. #Labor: Some progression now s/p cytotec x1. Attempted to place FB, however patient very uncomfortable with cervical checks and cervix extremely left lateral in canal. Will give an additional cytotec. Consider FB placement with IV fent on next check if still appropriate exam.  #Pain: Epidural when requests  #FWB: Cat 1  #GBS positive (PCN)   #A1GDM: CBG elevated (127), however had a light snack immediately prior. If persistently elevated, consider adding SSI.   [redacted]w[redacted]d, DO 5:33 PM

## 2020-10-04 NOTE — H&P (Signed)
OBSTETRIC ADMISSION HISTORY AND PHYSICAL  Erin Matthews is a 30 y.o. female G2P1001 with IUP at 38w0dby LMP presenting for IOL for A1GDM. She reports +FMs, No LOF, no VB, no blurry vision, headaches or peripheral edema, and RUQ pain.  She plans on breast feeding. She undecided on birth control, but aware of her options.  [facility] --[facility]  Estimated Date of Delivery: 10/11/20  Sono:    _0 , CWD, normal anatomy, cephalic presentation, 28453M 60% EFW   Prenatal History/Complications:  AI6OEHPolyhydramnios   Past Medical History: Past Medical History:  Diagnosis Date   Gestational diabetes    Marijuana use    Nicotine dependence 08/02/2013   Tibial plateau fracture, left 08/02/2013   Unspecified vitamin D deficiency 08/02/2013    Past Surgical History: Past Surgical History:  Procedure Laterality Date   FASCIOTOMY Left 08/03/2013   Procedure: ANTERIOR COMPARTMENT FASCIOTOMY;  Surgeon: MRozanna Box MD;  [facility]  Service: Orthopedics;  Laterality: Left;   MENISCUS REPAIR Left 08/03/2013   Procedure: REPAIR OF LATERAL MENISCUS;  Surgeon: MRozanna Box MD;  [facility]  Service: Orthopedics;  Laterality: Left;   ORIF TIBIA PLATEAU Left 08/03/2013   Procedure: OPEN REDUCTION INTERNAL FIXATION (ORIF) TIBIAL PLATEAU WITH REPAIR OF TIBIAL EMMINENCE;  Surgeon: MRozanna Box MD;  [facility]  Service: Orthopedics;  Laterality: Left;    Obstetrical History: OB History     Gravida  2   Para  1   Term  1   Preterm      AB      Living  1      SAB      IAB      Ectopic      Multiple  0   Live Births  1           Social History Social History   Socioeconomic History   Marital status: Single    Spouse name: Not on file   Number of children: Not on file   Years of education: Not on file   Highest education level: Not on file  Occupational History   Not on file  Tobacco Use    Smoking status: Former    Packs/day: 0.75    Types: Cigarettes    Quit date: 03/23/2016    Years since quitting: 4.5   Smokeless tobacco: Never  Vaping Use   Vaping Use: Never used  Substance and Sexual Activity   Alcohol use: Not Currently    Comment: last drink 3 years ago   Drug use: Not Currently    Comment: last used a week ago   Sexual activity: Yes    Partners: Male    Birth control/protection: None  Other Topics Concern   Not on file  Social History Narrative   Pt works at [employer] [place] [employer]   Social Determinants of HRadio broadcast assistantStrain: Not on file  Food Insecurity: No Food Insecurity   Worried About RCharity fundraiserin the Last Year: Never true   RArboriculturistin the Last Year: Never true  Transportation Needs: Not on file  Physical Activity: Not on file  Stress: Not on file  Social Connections: Not on file    Family History: Family History  Problem Relation Age of Onset   Diabetes Sister    Diabetes Paternal  Uncle     Allergies: No Known Allergies  Medications Prior to Admission  Medication Sig Dispense Refill Last Dose   Accu-Chek Softclix Lancets lancets Please check blood sugar up to four times day. 100 each 1    albuterol (PROVENTIL) (2.5 MG/3ML) 0.083% nebulizer solution Take 3 mLs (2.5 mg total) by nebulization every 6 (six) hours as needed for wheezing or shortness of breath. 75 mL 12    aspirin EC 81 MG tablet Take 1 tablet (81 mg total) by mouth daily. Swallow whole. 30 tablet 5    Blood Pressure Monitoring (BLOOD PRESSURE KIT) DEVI 1 kit by Does not apply route once a week. 1 each 0    cyclobenzaprine (FLEXERIL) 10 MG tablet Take 1 tablet (10 mg total) by mouth every 8 (eight) hours as needed for muscle spasms. (Patient not taking: Reported on 09/29/2020) 20 tablet 0    glucose blood (ACCU-CHEK GUIDE) test strip Use to check blood sugars four times a day was instructed 50 each 12    metoCLOPramide  (REGLAN) 10 MG tablet Take 1 tablet (10 mg total) by mouth 3 (three) times daily with meals as needed for nausea. (Patient not taking: Reported on 09/29/2020) 90 tablet 2    Prenatal Vit-Fe Fumarate-FA (PREPLUS) 27-1 MG TABS Take 1 tablet by mouth daily. (Patient not taking: No sig reported) 30 tablet 13    promethazine (PHENERGAN) 12.5 MG tablet Take 1-2 tablets (12.5-25 mg total) by mouth at bedtime as needed for nausea or vomiting. (Patient not taking: No sig reported) 30 tablet 2      Review of Systems   All systems reviewed and negative except as stated in HPI  Last menstrual period 01/05/2020, unknown if currently breastfeeding. General appearance: alert, cooperative, and no distress Lungs: clear to auscultation bilaterally Heart: regular rate and rhythm Abdomen: soft, non-tender; bowel sounds normal Pelvic: NEFG Extremities: Homans sign is negative, no sign of DVT Presentation: cephalic confirmed with bedside U/S  Fetal monitoringBaseline: 130 bpm, Variability: Good {> 6 bpm), Accelerations: Reactive, and Decelerations: Absent Uterine activityFrequency: Every 9-10 minutes    Prenatal labs: ABO, Rh: O/Positive/-- (01/31 1604) Antibody: Negative (01/31 1604) Rubella: 1.11 (01/31 1604) RPR: Non Reactive (06/06 1129)  HBsAg: Negative (01/31 1604)  HIV: Non Reactive (06/06 1129)  GBS: Positive/-- (08/02 0213)  1 hr Glucola Failed - GDM Genetic screening  Low Risk NIPS Anatomy US normal  Prenatal Transfer Tool  Maternal Diabetes: Yes:  Diabetes Type:  Diet controlled Genetic Screening: Normal Maternal Ultrasounds/Referrals: Normal Fetal Ultrasounds or other Referrals:  None Maternal Substance Abuse:  No Significant Maternal Medications:  None Significant Maternal Lab Results: Group B Strep positive  No results found for this or any previous visit (from the past 24 hour(s)).  Patient Active Problem List   Diagnosis Date Noted   Diet controlled gestational diabetes  mellitus (GDM) in third trimester 10/04/2020   [redacted] weeks gestation of pregnancy 09/29/2020   Group B Streptococcus carrier, +RV culture, currently pregnant 09/18/2020   [redacted] weeks gestation of pregnancy 09/16/2020   Polyhydramnios 07/21/2020   Biological false positive RPR test 03/19/2020   History of gestational diabetes 03/17/2020   Encounter for supervision of normal pregnancy in multigravida in first trimester 03/13/2020   Gestational diabetes 08/12/2016   Marijuana use 08/06/2013    Assessment/Plan:  Erin Matthews is a 30 y.o. G2P1001 at 56w0dhere for IOL due to A1GDM and mild polyhydramnios.   #Labor: Cyotec placed. Serial cervical exams.  #Pain: IV  PRN  #FWB: Cat 1  #ID: GBS positive, PCN  #MOF: Breast #MOC: Undecided, but aware of her options  #Circ: Yes, inpatient   #A1GDM:  Well controlled at home. Initial CBG elevated after drinking Gatorade. Will monitor q4.   #Mild polyhydramnios: AFI 25cm on U/S 7/25.   Pearla Dubonnet, MD  10/04/2020, 9:03 AM

## 2020-10-04 NOTE — Anesthesia Procedure Notes (Signed)
Epidural Patient location during procedure: OB Start time: 10/04/2020 8:11 PM End time: 10/04/2020 8:25 PM  Preanesthetic Checklist Completed: patient identified, IV checked, site marked, risks and benefits discussed, surgical consent, monitors and equipment checked, pre-op evaluation and timeout performed  Epidural Patient position: sitting Prep: DuraPrep and site prepped and draped Patient monitoring: continuous pulse ox and blood pressure Approach: midline Location: L4-L5 Injection technique: LOR air  Needle:  Needle type: Tuohy  Needle gauge: 17 G Needle length: 9 cm and 9 Needle insertion depth: 4 cm Catheter type: closed end flexible Catheter size: 19 Gauge Catheter at skin depth: 9 cm Test dose: negative  Assessment Events: blood not aspirated, injection not painful, no injection resistance, no paresthesia and negative IV test

## 2020-10-04 NOTE — Progress Notes (Signed)
Erin Matthews is a 30 y.o. G2P1001 at [redacted]w[redacted]d admitted for induction of labor due to A1GDM  .  Subjective: Patient feeling contractions. Epidural helping with pain.  Objective: BP 128/74   Pulse 66   Temp 97.7 F (36.5 C) (Axillary)   Resp 16   Ht 5\' 1"  (1.549 m)   Wt 62.9 kg   LMP 01/05/2020   SpO2 100%   BMI 26.19 kg/m  No intake/output data recorded. No intake/output data recorded.  FHT:  FHR: 120 bpm, variability: moderate,  accelerations:  Present,  decelerations:  Absent UC:   regular, every 1-2 minutes SVE:   Dilation: 5 Effacement (%): 70 Station: -3 Exam by:: MHopkins RN  Labs: Lab Results  Component Value Date   WBC 6.2 10/04/2020   HGB 11.1 (L) 10/04/2020   HCT 34.0 (L) 10/04/2020   MCV 96.6 10/04/2020   PLT 201 10/04/2020    Assessment / Plan: Induction of labor due to GDMA1  Labor: Progressing on Pitocin, will continue to increase then AROM Fetal Wellbeing:  Category I Pain Control:  Epidural I/D:   GBS> PCN Anticipated MOD:  NSVD  #GDMA1 Last BG 98, q4hr CBG  10/06/2020, MD, MPH OB Fellow, Faculty Practice

## 2020-10-04 NOTE — Progress Notes (Signed)
Patient ambulating in the hallway from [phone]. Maternal tracing. Nurse adjusting [place].   Mhopkins RN

## 2020-10-05 ENCOUNTER — Encounter (HOSPITAL_COMMUNITY): Payer: Self-pay | Admitting: Obstetrics and Gynecology

## 2020-10-05 DIAGNOSIS — O99824 Streptococcus B carrier state complicating childbirth: Secondary | ICD-10-CM

## 2020-10-05 DIAGNOSIS — O403XX0 Polyhydramnios, third trimester, not applicable or unspecified: Secondary | ICD-10-CM

## 2020-10-05 DIAGNOSIS — Z3A39 39 weeks gestation of pregnancy: Secondary | ICD-10-CM

## 2020-10-05 DIAGNOSIS — O24420 Gestational diabetes mellitus in childbirth, diet controlled: Secondary | ICD-10-CM

## 2020-10-05 LAB — CBC
HCT: 34.5 % — ABNORMAL LOW (ref 36.0–46.0)
Hemoglobin: 11.4 g/dL — ABNORMAL LOW (ref 12.0–15.0)
MCH: 31.8 pg (ref 26.0–34.0)
MCHC: 33 g/dL (ref 30.0–36.0)
MCV: 96.1 fL (ref 80.0–100.0)
Platelets: 202 10*3/uL (ref 150–400)
RBC: 3.59 MIL/uL — ABNORMAL LOW (ref 3.87–5.11)
RDW: 12.6 % (ref 11.5–15.5)
WBC: 12.6 10*3/uL — ABNORMAL HIGH (ref 4.0–10.5)
nRBC: 0 % (ref 0.0–0.2)

## 2020-10-05 LAB — GLUCOSE, CAPILLARY
Glucose-Capillary: 100 mg/dL — ABNORMAL HIGH (ref 70–99)
Glucose-Capillary: 108 mg/dL — ABNORMAL HIGH (ref 70–99)

## 2020-10-05 MED ORDER — DIPHENHYDRAMINE HCL 25 MG PO CAPS: 25.0000 mg | ORAL_CAPSULE | Freq: Four times a day (QID) | ORAL | Status: DC | PRN

## 2020-10-05 MED ORDER — ONDANSETRON HCL 4 MG/2ML IJ SOLN
4.0000 mg | INTRAMUSCULAR | Status: DC | PRN
Start: 2020-10-05 — End: 2020-10-06

## 2020-10-05 MED ORDER — ONDANSETRON HCL 4 MG PO TABS: 4.0000 mg | ORAL_TABLET | ORAL | Status: DC | PRN

## 2020-10-05 MED ORDER — SIMETHICONE 80 MG PO CHEW: 80.0000 mg | CHEWABLE_TABLET | ORAL | Status: DC | PRN

## 2020-10-05 MED ORDER — IBUPROFEN 600 MG PO TABS
600.0000 mg | ORAL_TABLET | Freq: Four times a day (QID) | ORAL | Status: DC
  Administered 2020-10-05 – 2020-10-06 (×5): 600 mg via ORAL
  Filled 2020-10-05 (×5): qty 1

## 2020-10-05 MED ORDER — PRENATAL MULTIVITAMIN CH
1.0000 | ORAL_TABLET | Freq: Every day | ORAL | Status: DC
  Administered 2020-10-05 – 2020-10-06 (×2): 1 via ORAL
  Filled 2020-10-05 (×2): qty 1

## 2020-10-05 MED ORDER — MAGNESIUM HYDROXIDE 400 MG/5ML PO SUSP
30.0000 mL | ORAL | Status: DC | PRN
Start: 2020-10-05 — End: 2020-10-06

## 2020-10-05 MED ORDER — BENZOCAINE-MENTHOL 20-0.5 % EX AERO
1.0000 | INHALATION_SPRAY | CUTANEOUS | Status: DC | PRN
Start: 2020-10-05 — End: 2020-10-06
  Filled 2020-10-05: qty 56

## 2020-10-05 MED ORDER — ACETAMINOPHEN 325 MG PO TABS: 650.0000 mg | ORAL_TABLET | ORAL | Status: DC | PRN

## 2020-10-05 MED ORDER — WITCH HAZEL-GLYCERIN EX PADS: 1.0000 "application " | MEDICATED_PAD | CUTANEOUS | Status: DC | PRN

## 2020-10-05 MED ORDER — LACTATED RINGERS AMNIOINFUSION: INTRAVENOUS | Status: DC

## 2020-10-05 MED ORDER — DIBUCAINE (PERIANAL) 1 % EX OINT
1.0000 | TOPICAL_OINTMENT | CUTANEOUS | Status: DC | PRN
Start: 2020-10-05 — End: 2020-10-06

## 2020-10-05 MED ORDER — COCONUT OIL OIL: 1.0000 "application " | TOPICAL_OIL | Status: DC | PRN

## 2020-10-05 MED ORDER — IBUPROFEN 600 MG PO TABS: 600.0000 mg | ORAL_TABLET | Freq: Once | ORAL | Status: DC

## 2020-10-05 NOTE — Lactation Note (Signed)
This note was copied from a baby's chart. Lactation Consultation Note  Patient Name: Erin Matthews [id number] Date: 10/05/2020 Reason for consult: L&D Initial assessment;Maternal endocrine disorder Age:31 hours  Mom is a P2 who nursed her 1st child for 2 weeks. Infant latched with ease in L&D, but only for a few minutes.   Lactation to f/u later today.   [name] 10/05/2020, 7:28 AM

## 2020-10-05 NOTE — Discharge Instructions (Signed)

## 2020-10-05 NOTE — Progress Notes (Signed)
Erin Matthews is a 30 y.o. G2P1001 at [redacted]w[redacted]d   Subjective: Comfortable with epidural. Support people at bedside and engaged in care  Objective: BP 125/79   Pulse 76   Temp 98.3 F (36.8 C) (Oral)   Resp 16   Ht 5\' 1"  (1.549 m)   Wt 62.9 kg   LMP 01/05/2020   SpO2 100%   BMI 26.19 kg/m  No intake/output data recorded. Total I/O In: -  Out: 500 [Urine:500]  FHT:  FHR: 125 bpm, variability: moderate,  accelerations:  Present,  decelerations:  Absent UC:   regular, every 2-3 minutes SVE:   Dilation: Lip/rim Effacement (%): 100 Station: 0 Exam by:: Sam W, CNM  Labs: Lab Results  Component Value Date   WBC 6.2 10/04/2020   HGB 11.1 (L) 10/04/2020   HCT 34.0 (L) 10/04/2020   MCV 96.6 10/04/2020   PLT 201 10/04/2020    Assessment / Plan: --30 y.o. G2P1001 at [redacted]w[redacted]d  --Reassuring fetal tracing, patient remains in hands and knees position --FSE previously placed by Dr. [redacted]w[redacted]d --Verbal consent obtained for repositioning to left lateral and IUPC placement --Left lateral tolerate well by patient and baby --Updated SVE: 9.5/100/0 --Will labor down unless intervention indicated by change in status --Anticipate NSVD  09-15-1969, CNM 10/05/2020, 1:49 AM

## 2020-10-05 NOTE — Progress Notes (Signed)
Mother called out and requested to be set up with DEBP. I took the kit to her room and walked through the set up with her. I educated mom on how to connect the pump parts, turn pump on, and assessed moms comfort with the pump setting. Mother was also educated on proper cleaning of pump parts and provided with supplies to do so. Before leaving mother noticed several drops of colostrum coming out, so we discussed proper storage of expressed milk and she was encouraged to try and feed baby expressed milk when finished pumping.   Marjory Lies, RN  10/05/20 6:04 PM

## 2020-10-05 NOTE — Progress Notes (Signed)
Erin Matthews is a 30 y.o. G2P1001 at [redacted]w[redacted]d admitted for induction of labor due to GDM and Polyhydramnios.  Subjective: Patient is feeling contractions but comfortable with epidural.   Objective: BP (!) 148/90   Pulse 92   Temp 98.3 F (36.8 C) (Oral)   Resp 16   Ht 5\' 1"  (1.549 m)   Wt 62.9 kg   LMP 01/05/2020   SpO2 100%   BMI 26.19 kg/m  No intake/output data recorded. No intake/output data recorded.  FHT:  FHR: 120 bpm, variability: moderate,  accelerations:  Present,  decelerations:  Present unclear if early or late given difficulty tracing contractions At 0032 with deceleration to 70s-80s for 4 minutes, resuscitated with IV fluid, position change to hands and knees and stopping pitocin. UC:   regular SVE:   Dilation: 9 Effacement (%): 80 Station: 0 Exam by:: Dr [email or web address]  Labs: Lab Results  Component Value Date   WBC 6.2 10/04/2020   HGB 11.1 (L) 10/04/2020   HCT 34.0 (L) 10/04/2020   MCV 96.6 10/04/2020   PLT 201 10/04/2020    Assessment / Plan: Induction of labor due to gestational diabetes    Labor:  Giving pitocin break given deceleration (pitocin was at 2), will plan to restart once out of OR from other emergent case as tolerated by fetus. S/p AROM Fetal Wellbeing:  Category II Pain Control:  Epidural I/D:   GBS positive Anticipated MOD:  NSVD  10/06/2020 10/05/2020, 12:51 AM

## 2020-10-05 NOTE — Discharge Summary (Signed)
Postpartum Discharge Summary  Patient Name: Erin Matthews DOB: Jan 18, 1991 MRN: [id number]  Date of admission: 10/04/2020 Delivery date:10/05/2020  Delivering provider: Darlina Rumpf  Date of discharge: 10/06/2020  Admitting diagnosis: Diet controlled gestational diabetes mellitus (GDM) in third trimester [O24.410] Intrauterine pregnancy: [redacted]w[redacted]d    Secondary diagnosis:  Active Problems:   Gestational diabetes   Encounter for supervision of normal pregnancy in multigravida in first trimester   Biological false positive RPR test   Polyhydramnios   Group B Streptococcus carrier, +RV culture, currently pregnant   Diet controlled gestational diabetes mellitus (GDM) in third trimester Polyhydramnios  Additional problems: N/A    Discharge diagnosis: Term Pregnancy Delivered and GDM A1                                              Post partum procedures: N/A Augmentation: AROM, Pitocin, and Cytotec Complications: None  Hospital course: Induction of Labor With Vaginal Delivery   30y.o. yo G2P1001 at 383w1das admitted to the hospital 10/04/2020 for induction of labor.  Indication for induction: A1 DM.  Patient had an uncomplicated labor course as follows: Membrane Rupture Time/Date: 12:21 AM ,10/05/2020   Delivery Method:Vaginal, Spontaneous  Episiotomy: None  Lacerations:  2nd degree;Perineal  Details of delivery can be found in separate delivery note.  Patient had a routine postpartum course. Patient is discharged home 10/06/20.  Newborn Data: Birth date:10/05/2020  Birth time:6:46 AM  Gender:Female  Living status:Living  Apgars:8 ,9  Weight:2835 g   Magnesium Sulfate received: No BMZ received: No Rhophylac:N/A MMR:No T-DaP:Given prenatally Flu: N/A Transfusion:No  Physical exam  Vitals:   10/05/20 1430 10/05/20 1808 10/05/20 2204 10/06/20 0622  BP: 116/69 118/74 112/77 118/78  Pulse: (!) 56 62 67 70  Resp: 18 18 16 18   Temp: 98 F (36.7 C) 97.7 F (36.5 C) 98.1  F (36.7 C) 98.4 F (36.9 C)  TempSrc: Oral Oral Oral Oral  SpO2: 100%  100% 100%  Weight:      Height:       General: alert Lochia: appropriate Uterine Fundus: firm DVT Evaluation: No evidence of DVT seen on physical exam. Labs: Lab Results  Component Value Date   WBC 12.6 (H) 10/05/2020   HGB 11.4 (L) 10/05/2020   HCT 34.5 (L) 10/05/2020   MCV 96.1 10/05/2020   PLT 202 10/05/2020   CMP Latest Ref Rng & Units 10/04/2020  Glucose 70 - 99 mg/dL 126(H)  BUN 6 - 20 mg/dL 7  Creatinine 0.44 - 1.00 mg/dL 0.68  Sodium 135 - 145 mmol/L 138  Potassium 3.5 - 5.1 mmol/L 3.7  Chloride 98 - 111 mmol/L 108  CO2 22 - 32 mmol/L 19(L)  Calcium 8.9 - 10.3 mg/dL 8.8(L)  Total Protein 6.5 - 8.1 g/dL 6.2(L)  Total Bilirubin 0.3 - 1.2 mg/dL 0.5  Alkaline Phos 38 - 126 U/L 271(H)  AST 15 - 41 U/L 20  ALT 0 - 44 U/L 14   Edinburgh Score: Edinburgh Postnatal Depression Scale Screening Tool 10/05/2020  I have been able to laugh and see the funny side of things. 0  I have looked forward with enjoyment to things. 0  I have blamed myself unnecessarily when things went wrong. 0  I have been anxious or worried for no good reason. 0  I have felt scared or panicky for no good reason.  0  Things have been getting on top of me. 0  I have been so unhappy that I have had difficulty sleeping. 0  I have felt sad or miserable. 0  I have been so unhappy that I have been crying. 0  The thought of harming myself has occurred to me. 0  Edinburgh Postnatal Depression Scale Total 0     After visit meds:  Allergies as of 10/06/2020   No Known Allergies      Medication List     STOP taking these medications    aspirin EC 81 MG tablet   cyclobenzaprine 10 MG tablet Commonly known as: FLEXERIL   metoCLOPramide 10 MG tablet Commonly known as: Reglan   promethazine 12.5 MG tablet Commonly known as: PHENERGAN       TAKE these medications    Accu-Chek Guide test strip Generic drug: glucose  blood Use to check blood sugars four times a day was instructed   Accu-Chek Softclix Lancets lancets Please check blood sugar up to four times day.   acetaminophen 325 MG tablet Commonly known as: Tylenol Take 2 tablets (650 mg total) by mouth every 4 (four) hours as needed (for pain scale < 4).   albuterol (2.5 MG/3ML) 0.083% nebulizer solution Commonly known as: PROVENTIL Take 3 mLs (2.5 mg total) by nebulization every 6 (six) hours as needed for wheezing or shortness of breath.   Blood Pressure Kit Devi 1 kit by Does not apply route once a week.   ibuprofen 600 MG tablet Commonly known as: ADVIL Take 1 tablet (600 mg total) by mouth every 6 (six) hours.   PrePLUS 27-1 MG Tabs Take 1 tablet by mouth daily.        Discharge home in stable condition Infant Feeding: Breast Infant Disposition:home with mother Discharge instruction: per After Visit Summary and Postpartum booklet. Activity: Advance as tolerated. Pelvic rest for 6 weeks.  Diet: routine diet Future Appointments: Future Appointments  Date Time Provider [place]  11/17/2020  8:15 AM Constant, Vickii Chafe, MD [place] None  11/17/2020  8:45 AM [facility] None   Follow up Visit: Postpartum appointment request sent by Maryelizabeth Kaufmann, CNM on 10/05/2020  Please schedule this patient for a In person postpartum visit in 4 weeks with the following provider: Any provider. Additional Postpartum F/U:2 hour GTT  High risk pregnancy complicated by:  N2DPO and Polyhydramnios Delivery mode:  Vaginal, Spontaneous  Anticipated Birth Control:   Declines  Renard Matter, MD, MPH OB Fellow, Faculty Practice

## 2020-10-05 NOTE — Lactation Note (Signed)
This note was copied from a baby's chart. Lactation Consultation Note  Patient Name: Erin Matthews [id number] Date: 10/05/2020 Reason for consult: Initial assessment;Primapara;Term;1st time breastfeeding Age:31 hours   P2 mother whose infant is now 30 hours old.  This is a term baby at 39+1 weeks.  Mother breast fed her first child for 2 weeks.  Mother's feeding preference is breast/formula.  Breast feeding basics reviewed with mother.  She has been taught hand expression by her RN.  Encouraged to hold baby STS and practice hand expression before/after feedings to help with milk supply.  Mother will feed 8-12 times/24 hours or sooner if baby shows feeding cues.  Mother aware of cues.  Suggested she call her RN/LC for latch assistance as needed.  Mom made aware of O/P services, breastfeeding support groups, community resources, and our phone # for post-discharge questions.  Mother will be receiving a DEBP from her insurance company.  No support person present at this time.  RN in room at the end of my visit to assist mother with basic care.   Maternal Data Has patient been taught Hand Expression?: Yes Does the patient have breastfeeding experience prior to this delivery?: Yes How long did the patient breastfeed?: 2 weeks  Feeding Mother's Current Feeding Choice: Breast Milk and Formula  LATCH Score                    Lactation Tools Discussed/Used    Interventions Interventions: Breast feeding basics reviewed  Discharge Pump: Personal (Will be receiving a DEBP from her insurance company)  Consult Status Consult Status: Follow-up Date: 10/06/20 Follow-up type: In-patient    Dora Sims 10/05/2020, 12:25 PM

## 2020-10-05 NOTE — Anesthesia Postprocedure Evaluation (Signed)
Anesthesia Post Note  Patient: Erin Matthews  Procedure(s) Performed: AN AD HOC LABOR EPIDURAL     Patient location during evaluation: SICU Anesthesia Type: Epidural Level of consciousness: sedated Pain management: pain level controlled Vital Signs Assessment: post-procedure vital signs reviewed and stable Respiratory status: patient remains intubated per anesthesia plan Cardiovascular status: stable Postop Assessment: no apparent nausea or vomiting Anesthetic complications: no   No notable events documented.  Last Vitals:  Vitals:   10/05/20 0531 10/05/20 0601  BP: (!) 120/56 (!) 138/101  Pulse: 76 84  Resp: 16   Temp:    SpO2:      Last Pain:  Vitals:   10/05/20 0331  TempSrc: Oral  PainSc: 0-No pain   Pain Goal: Patients Stated Pain Goal: 0 (10/04/20 2000)                 Darrin Koman

## 2020-10-06 LAB — GLUCOSE, CAPILLARY
Glucose-Capillary: 63 mg/dL — ABNORMAL LOW (ref 70–99)
Glucose-Capillary: 94 mg/dL (ref 70–99)

## 2020-10-06 MED ORDER — IBUPROFEN 600 MG PO TABS: 600.0000 mg | ORAL_TABLET | Freq: Four times a day (QID) | ORAL | 0 refills | Status: AC

## 2020-10-06 MED ORDER — ACETAMINOPHEN 325 MG PO TABS: 650.0000 mg | ORAL_TABLET | ORAL | 0 refills | Status: AC | PRN

## 2020-10-06 NOTE — Lactation Note (Signed)
This note was copied from a baby's chart. Lactation Consultation Note  Patient Name: Erin Matthews [id number] Date: 10/06/2020 Reason for consult: Follow-up assessment Age:31 hours   P2 mother whose infant is now 92 hours old.  This is a term baby at 39+1 weeks.  Mother breast fed her first child for 2 weeks.  Mother's feeding preference is breast/formula.  Met mother in hallway as she was out walking.  Mother had no questions/concerns related to breast feeding.  Mother continues to practice latching and is also supplementing with her EBM.  The last volume obtained was 10 mls.  Baby is voiding/stooling well.    Mother has our OP phone number for any concerns after discharge.  She has a DEBP for home use.     Maternal Data    Feeding    LATCH Score                    Lactation Tools Discussed/Used    Interventions    Discharge    Consult Status Consult Status: Complete Date: 10/06/20 Follow-up type: Call as needed    Jaculin Rasmus R Angeleah Labrake 10/06/2020, 1:22 PM

## 2020-10-06 NOTE — Progress Notes (Signed)
Fasting CBG 63. Crackers and juice given. Recheck was 94.

## 2020-10-06 NOTE — Clinical Social Work Maternal (Addendum)
CLINICAL SOCIAL WORK MATERNAL/CHILD NOTE  Patient Details  Name: Erin Matthews MRN: [id number] Date of Birth: 12-26-1990  Date:  10/06/2020  Clinical Social Worker Initiating Note:  Kathrin Greathouse, [place] Date/Time: Initiated:  10/06/20/0945     Child's Name:  Lodema Hong   Biological Parents:  Mother, Father (Father: Danise Mina 04-15-79)   Need for Interpreter:  None   Reason for Referral:  Current Substance Use/Substance Use During Pregnancy     Address:  [address] [place] [address]    Phone number:  [phone] (home)     Additional phone number:  Household Members/Support Persons (HM/SP):   Household Member/Support Person 1, Household Member/Support Person 2   HM/SP Name Relationship DOB or Age  HM/SP -[place] Significant Other 04-15-1979  HM/SP -2 [place] Daughter 3  HM/SP -3        HM/SP -4        HM/SP -5        HM/SP -6        HM/SP -7        HM/SP -8          Natural Supports (not living in the home):  Extended Family, Immediate Family   Professional Supports: None   Employment: Full-time   Type of Work: [employer]   Education:  [place] arranged:    Museum/gallery curator Resources:  Medicaid   Other Resources:  Physicist, medical  , [place] Considerations Which May Impact Care:    Strengths:  Ability to meet basic needs  , Home prepared for child  , Pediatrician chosen   Psychotropic Medications:         Pediatrician:    [place] area  Pediatrician List:   [facility] ([address])  [place]      Pediatrician Fax Number:    Risk Factors/Current Problems:  Substance Use     Cognitive State:  Able to Concentrate  , Insightful  , Alert  , Linear Thinking     Mood/Affect:  Calm  , Comfortable  , Bright  , Happy     CSW Assessment: CSW  received consult for drug exposed newborn. CSW met with MOB to offer support and complete assessment.   CSW met with MOB at bedside. CSW congratulated MOB and introduced CSW role. CSW observed MOB sitting up in bed and infant was out for circumcision procedure. MOB presented calm, happy and engaged with CSW. MOB confirmed the demographic information on file is correct. MOB reported she lives in the household with her significant other/FOB Danise Mina and daughter Matasha Smigelski (see chart above). CSW inquired how MOB has felt emotionally since giving birth. MOB shared she is feeling good and that the labor and delivery went well. MOB shared throughout her pregnancy she felt excited about the baby and expressed how excited her three-year-old is about the baby as well.   CSW inquired about MOB substance use. MOB disclosed she used marijuana throughout her pregnancy because she wanted to have an appetite to eat. She reported the last time she used was about 25 days ago. MOB denied using any other substance. CSW informed MOB about the hospital drug screen policy. CSW made MOB that CSW will follow the infant UDS, CDS and make a report to CPS if warranted. MOB  reported that she has CPS history with her older child because she also used THC three years ago during her pregnancy, a report was filed with [place] and the case was closed within 30 days. MOB reported understanding of the CPS follow up process and had no questions.   CSW inquired if MOB has a history of mental health, therapy or medication treatment. MOB reported no history of mental health, therapy or medication treatment.  CSW inquired if MOB experienced postpartum depression/anxiety after the birth of her older child. MOB reported during the first two weeks after giving birth she cried a lot for no reason. MOB reported she had no concerns after about two weeks. CSW provided education regarding the baby blues period vs. perinatal mood  disorders, discussed treatment and gave resources for mental health follow up if concerns arise. CSW recommended self-evaluation during the postpartum time period using the New Mom Checklist from Postpartum Progress and encouraged MOB to contact a medical professional if symptoms are noted at any time. MOB reported she feels comfortable reaching out to her physician if concerns arise. CSW assessed MOB for safety. MOB denied thoughts of harm to self and others. CSW inquired about MOB supports. MOB identified FOB, mom, sister, additional family member and FOB family as supports.   CSW provided review of Sudden Infant Death Syndrome (SIDS) precautions. MOB reported she has essential items for the infant and a bassinet where the infant will safely sleep. MOB reported she receives both WIC/FS benefits. MOB has chosen [facility] for infant's follow up care. CSW assessed MOB for additional needs. MOB reported no further needs.    -CSW will follow infant's UDS, CDS and make a report to CPS if warranted.  CSW identifies no further need for intervention and no barriers to discharge at this time.     CSW Plan/Description:  Sudden Infant Death Syndrome (SIDS) Education, [place], No Further Intervention Required/No Barriers to Discharge, Supplemental Security Income (SSI) Information, Perinatal Mood and Anxiety Disorder (PMADs) Education, CSW Will Continue to Monitor Umbilical Cord Tissue Drug Screen Results and Make Report if [place], LCSW 10/06/2020, 11:36AM

## 2020-10-15 ENCOUNTER — Telehealth (HOSPITAL_COMMUNITY): Payer: Self-pay | Admitting: *Deleted

## 2020-10-15 NOTE — Telephone Encounter (Signed)
Attempted hospital discharge follow-up call. Left message for patient to return RN call. Deforest Hoyles, RN 10/15/20, [phone].

## 2020-11-06 ENCOUNTER — Telehealth: Payer: Self-pay

## 2020-11-06 NOTE — Telephone Encounter (Signed)
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 Will write letter and have in chart for patient to print off. Pt would like to return to work October 19. Pt is allowed up to 12 weeks leave per [facility]. Pt is agreeable to plan and verbalized understanding.

## 2020-11-17 ENCOUNTER — Other Ambulatory Visit: Payer: Medicaid Other

## 2020-11-17 ENCOUNTER — Other Ambulatory Visit: Payer: Self-pay

## 2020-11-17 ENCOUNTER — Ambulatory Visit (INDEPENDENT_AMBULATORY_CARE_PROVIDER_SITE_OTHER): Payer: Medicaid Other | Admitting: Obstetrics and Gynecology

## 2020-11-17 ENCOUNTER — Encounter: Payer: Self-pay | Admitting: Obstetrics and Gynecology

## 2020-11-17 VITALS — BP 113/73 | HR 86 | Wt 122.0 lb

## 2020-11-17 DIAGNOSIS — O24410 Gestational diabetes mellitus in pregnancy, diet controlled: Secondary | ICD-10-CM

## 2020-11-17 DIAGNOSIS — O24430 Gestational diabetes mellitus in the puerperium, diet controlled: Secondary | ICD-10-CM

## 2020-11-17 NOTE — Progress Notes (Signed)
Post Partum Visit Note  Erin Matthews is a 30 y.o. G72P2002 female who presents for a postpartum visit. She is 6 weeks postpartum following a normal spontaneous vaginal delivery.  I have fully reviewed the prenatal and intrapartum course. The delivery was at 39.1 gestational weeks.  Anesthesia: epidural. Postpartum course has been doing well. Baby is doing well. Baby is feeding by bottle Rush Barer goodstart . Bleeding staining only. Bowel function is normal. Bladder function is normal. Patient is not sexually active. Contraception method is abstinence.   Postpartum depression screening: negative, score 0.   The pregnancy intention screening data noted above was reviewed. Potential methods of contraception were discussed. The patient elected to proceed with No data recorded.   Edinburgh Postnatal Depression Scale - 11/17/20 0843       Edinburgh Postnatal Depression Scale:  In the Past 7 Days   I have been able to laugh and see the funny side of things. 0    I have looked forward with enjoyment to things. 0    I have blamed myself unnecessarily when things went wrong. 0    I have been anxious or worried for no good reason. 0    I have felt scared or panicky for no good reason. 0    Things have been getting on top of me. 0    I have been so unhappy that I have had difficulty sleeping. 0    I have felt sad or miserable. 0    I have been so unhappy that I have been crying. 0    The thought of harming myself has occurred to me. 0    Edinburgh Postnatal Depression Scale Total 0             Health Maintenance Due  Topic Date Due   COVID-19 Vaccine (1) Never done   URINE MICROALBUMIN  Never done   INFLUENZA VACCINE  Never done       Review of Systems Pertinent items noted in HPI and remainder of comprehensive ROS otherwise negative.  Objective:  BP 113/73   Pulse 86   Wt 122 lb (55.3 kg)   LMP 01/05/2020   BMI 23.05 kg/m    General:  alert, cooperative, and no  distress   Breasts:  normal  Lungs: clear to auscultation bilaterally  Heart:  regular rate and rhythm  Abdomen: soft, non-tender; bowel sounds normal; no masses,  no organomegaly   Wound   GU exam:  normal       Assessment:    1. Diet controlled gestational diabetes mellitus (GDM), antepartum Postpartum glucola today  2. Routine postpartum follow-up Normal postpartum exam.   Plan:   Essential components of care per ACOG recommendations:  1.  Mood and well being: Patient with negative depression screening today. Reviewed local resources for support.  - Patient tobacco use? No.   - hx of drug use? No.    2. Infant care and feeding:  -Patient currently breastmilk feeding? No.  -Social determinants of health (SDOH) reviewed in EPIC. No concerns  3. Sexuality, contraception and birth spacing - Patient does not want a pregnancy in the next year.  Desired family size is 2 children.  - Reviewed forms of contraception in tiered fashion. Patient desired condoms, natural family planning (NFP) today.   - Discussed birth spacing of 18 months  4. Sleep and fatigue -Encouraged family/partner/community support of 4 hrs of uninterrupted sleep to help with mood and fatigue  5.  Physical Recovery  - Discussed patients delivery and complications. She describes her labor as good. - Patient had a Vaginal, no problems at delivery.  Perineal healing reviewed. Patient expressed understanding - Patient has urinary incontinence? No. - Patient is safe to resume physical and sexual activity  6.  Health Maintenance - HM due items addressed Yes - Last pap smear  Diagnosis  Date Value Ref Range Status  03/17/2020   Final   - Negative for intraepithelial lesion or malignancy (NILM)   Pap smear not done at today's visit.  -Breast Cancer screening indicated? No.   7. Chronic Disease/Pregnancy Condition follow up: Diabetes screening today  - PCP follow up  Catalina Antigua, MD [facility]  Healthcare, [facility]

## 2020-11-18 LAB — GLUCOSE TOLERANCE, 2 HOURS
Glucose, 2 hour: 79 mg/dL (ref 65–139)
Glucose, GTT - Fasting: 76 mg/dL (ref 65–99)

## 2020-11-21 ENCOUNTER — Encounter: Payer: Self-pay | Admitting: Radiology

## 2021-02-26 IMAGING — US US OB LIMITED
1 series · 5 of 5 positions shown · non-contrast
Comparison: none

[Series 1: us ob limited · 5 of 5 slices shown]
[im 1/5]
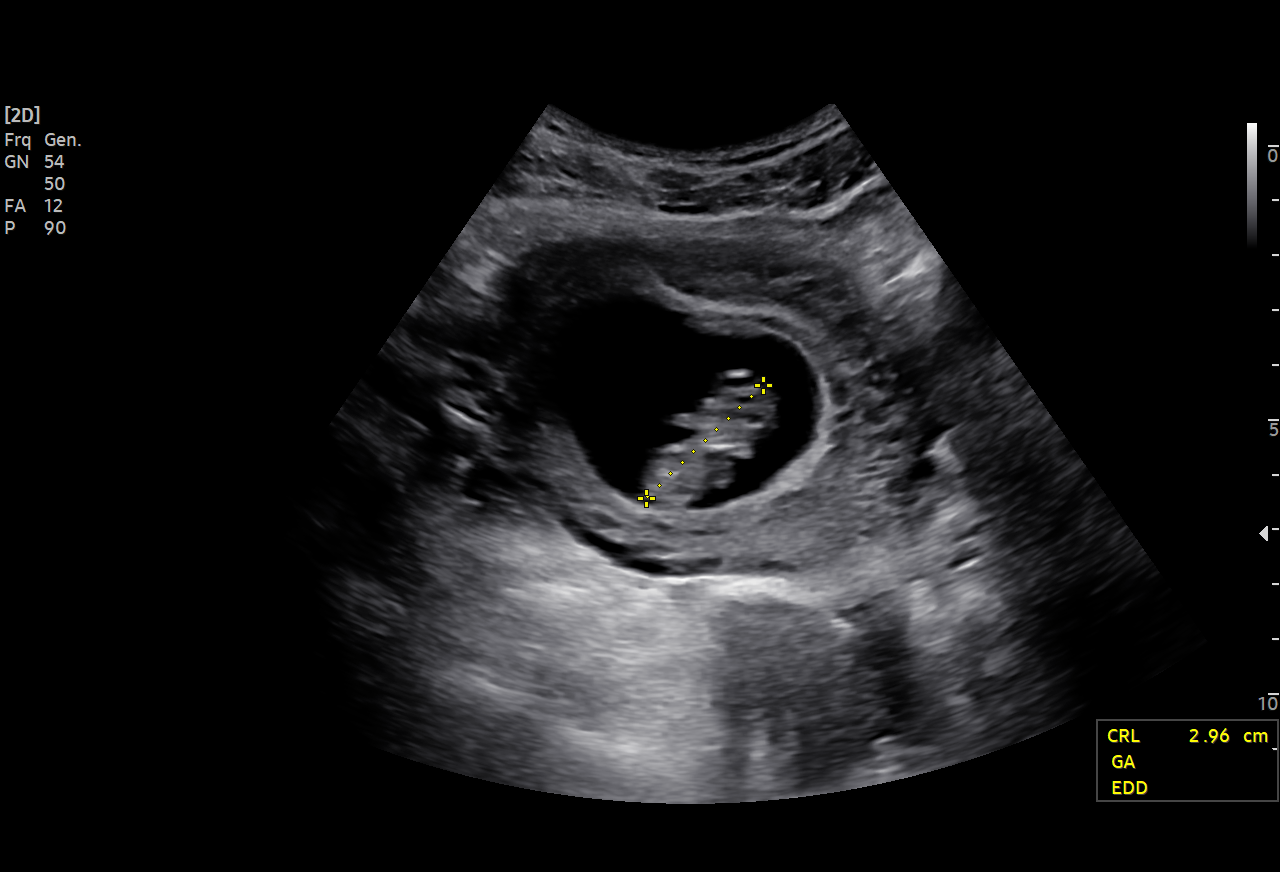
[im 2/5]
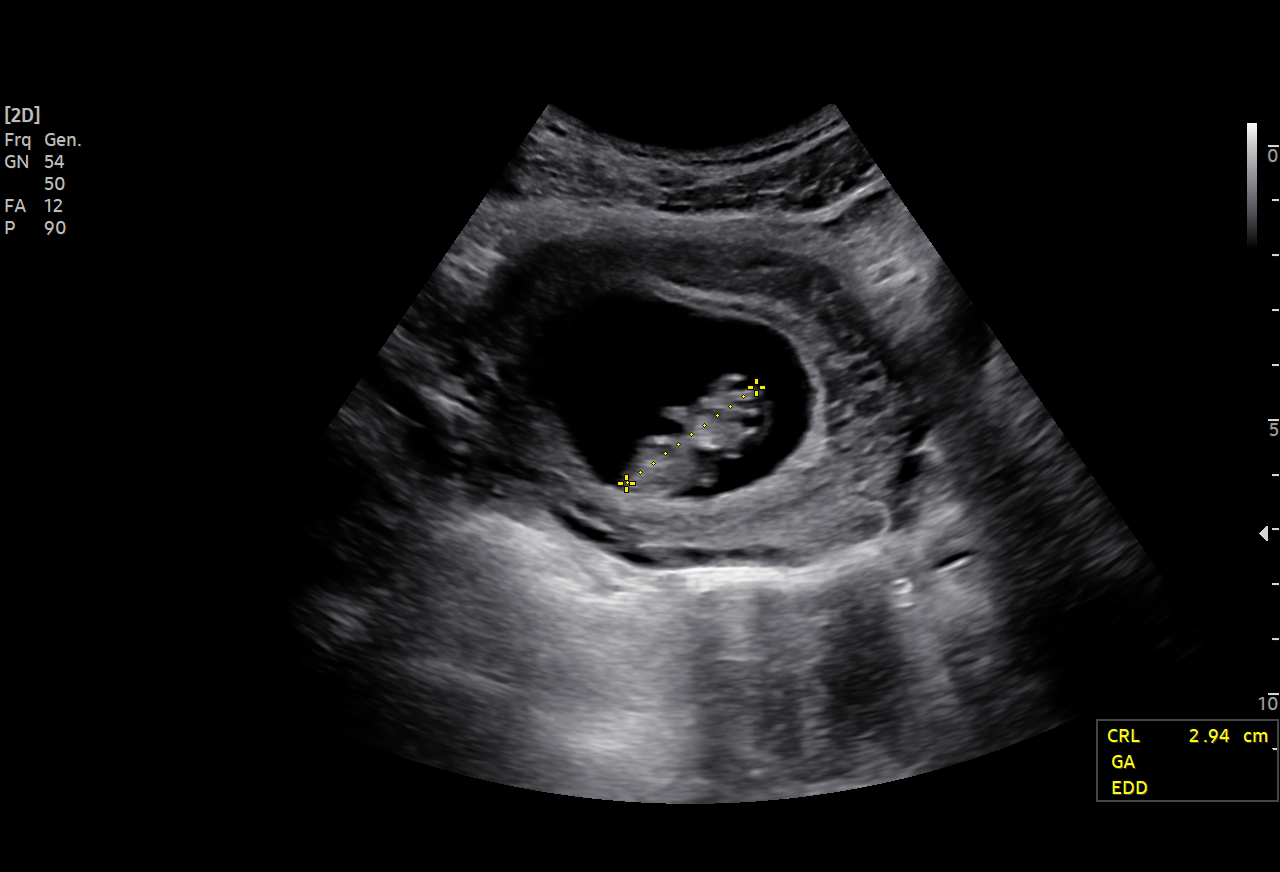
[im 3/5]
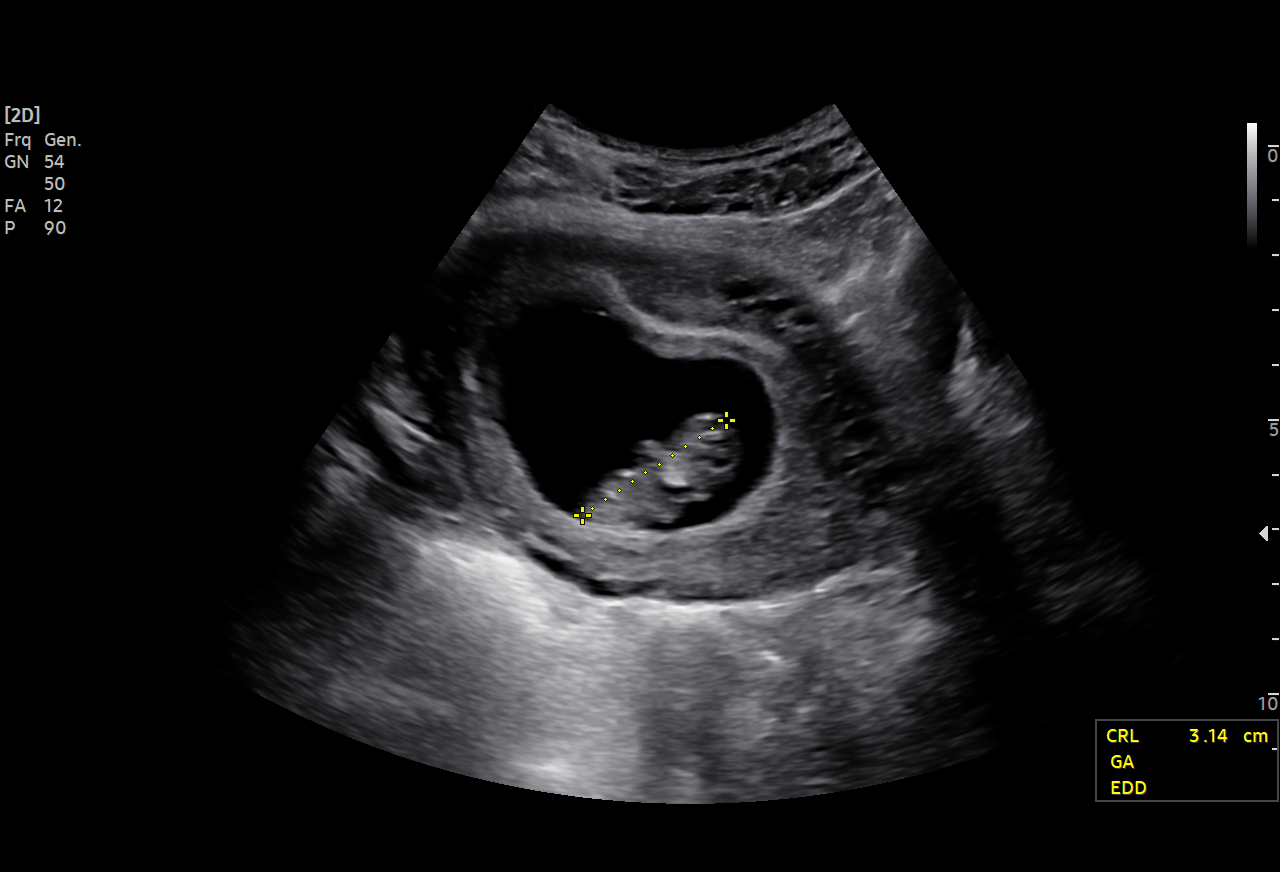
[im 4/5]
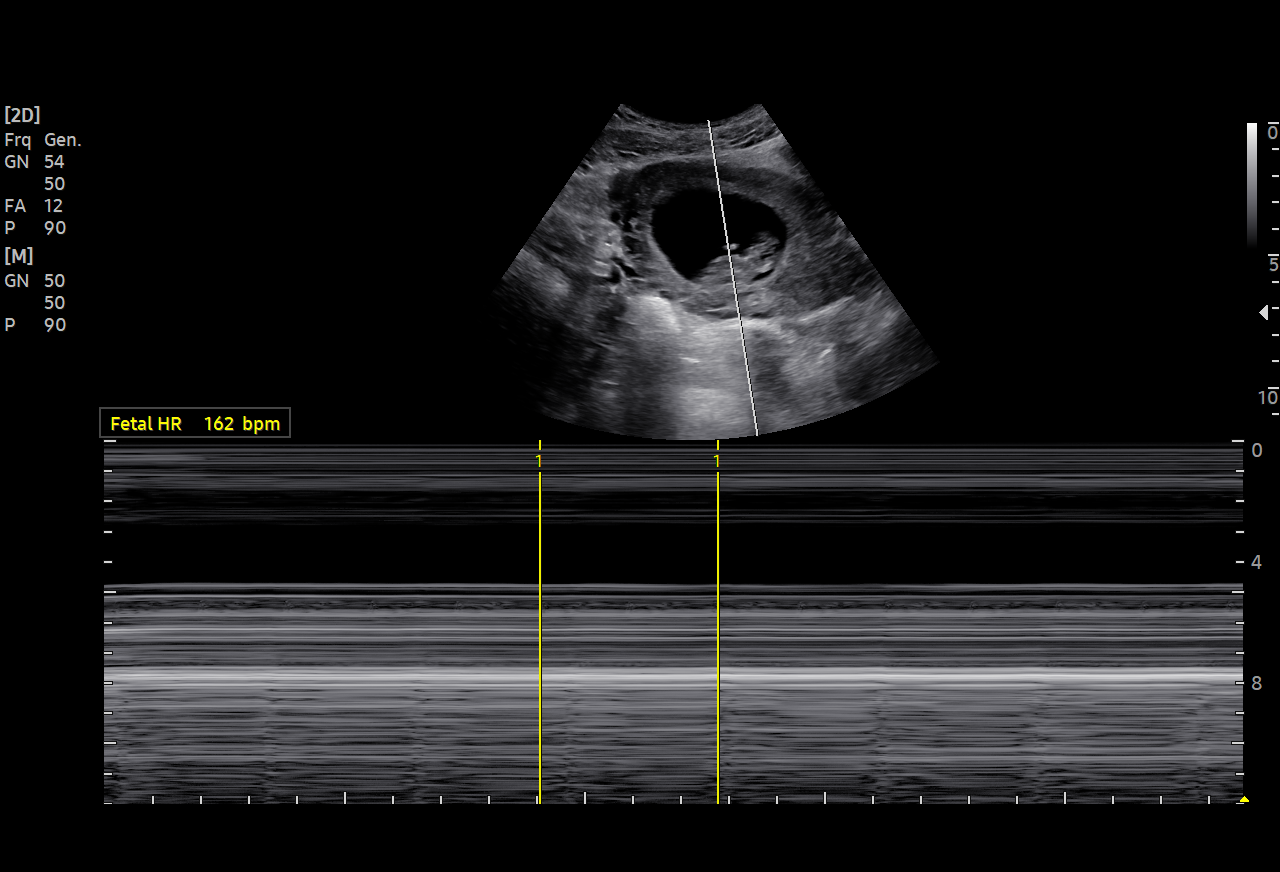
[im 5/5]
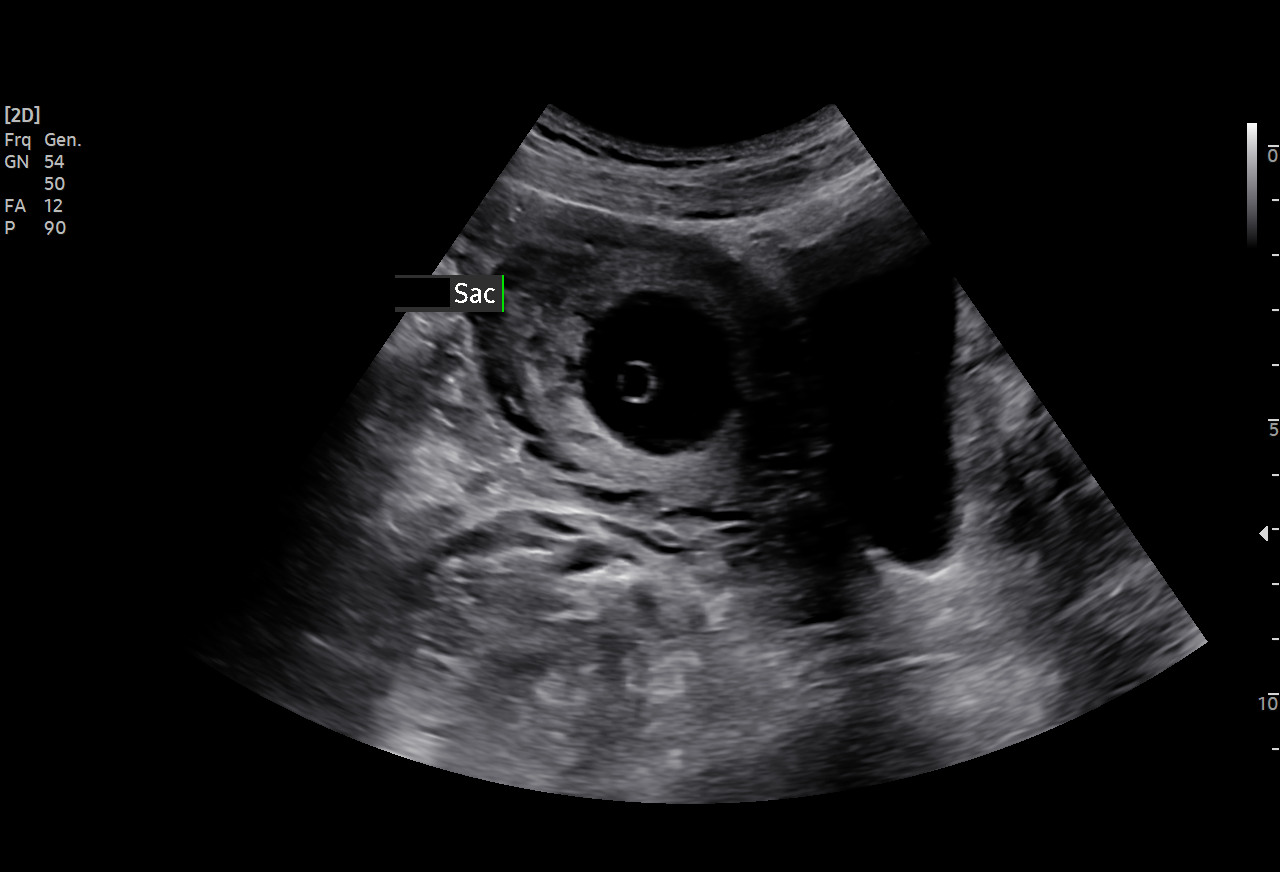

[5 of 5 positions shown; findings below may reference images not displayed]

Healthcare

 1   [HOSPITAL]                        76815.0      ADEBOLA VICK

Indications

 9 weeks gestation of pregnancy
Fetal Evaluation

 Num Of Fetuses:          1
 Fetal Heart Rate(bpm):   162
 Cardiac Activity:        Observed
Biometry

 CRL:      30.2   mm     G. Age:  9w 5d                    EDD:   10/11/20
Gestational Age

 Best:           9w 5d     Det. By:  U/S C R L (03/13/20)       EDD:  10/11/20
Comments

 Single live IUP at 9w5d by CRL. LMP 01/05/20.
Impression

 Viable intrauterine pregnancy
Recommendations

 Routine prenatal care
                 [place], [place]

## 2021-08-29 LAB — GLUCOSE, POCT (MANUAL RESULT ENTRY): POC Glucose: 124 mg/dl — AB (ref 70–99)

## 2021-10-27 ENCOUNTER — Other Ambulatory Visit: Payer: Self-pay | Admitting: Family Medicine

## 2022-10-05 ENCOUNTER — Encounter: Payer: Self-pay | Admitting: *Deleted

## 2022-10-05 NOTE — Progress Notes (Signed)
Pt attended 09/25/2022 screening event where her b/p was 102/64. At the event, the pt did not name a PCP and did not identify any SDOH insecurities. During f/u phone call, pt stated she did have a PCP at [facility], where she was receiving ongoing care, and she did not have any issues getting medications, vaccinations, etc.  Pt's PCP clinic encounters do not appear in [facility] to be able to verify most recent or any future appt at this time. Pt also agreed to accept a [facility] Get [facility] Now flyer in the mail, in case after hours or virtual/telehealth care needed when her PCP office was not available, so she could access healthcare besides having to go to an ED if not needed. No additional health equity team support indicated at this time.
